# Patient Record
Sex: Female | Born: 1965 | Race: White | Hispanic: No | Marital: Married | Smoking: Former smoker
Health system: Southern US, Community
[De-identification: ages and names within clinical notes are randomized; demographics above are authoritative.]

## PROBLEM LIST (undated history)

## (undated) DIAGNOSIS — K219 Gastro-esophageal reflux disease without esophagitis: Secondary | ICD-10-CM

## (undated) DIAGNOSIS — F329 Major depressive disorder, single episode, unspecified: Secondary | ICD-10-CM

## (undated) DIAGNOSIS — E785 Hyperlipidemia, unspecified: Secondary | ICD-10-CM

## (undated) DIAGNOSIS — F419 Anxiety disorder, unspecified: Secondary | ICD-10-CM

## (undated) DIAGNOSIS — M6740 Ganglion, unspecified site: Secondary | ICD-10-CM

## (undated) DIAGNOSIS — F32A Depression, unspecified: Secondary | ICD-10-CM

## (undated) HISTORY — PX: CYST REMOVAL TRUNK: SHX6283

---

## 2002-08-10 ENCOUNTER — Other Ambulatory Visit: Admission: RE | Admit: 2002-08-10 | Discharge: 2002-08-10 | Payer: Self-pay | Admitting: Obstetrics and Gynecology

## 2003-06-21 ENCOUNTER — Other Ambulatory Visit: Admission: RE | Admit: 2003-06-21 | Discharge: 2003-06-21 | Payer: Self-pay | Admitting: Obstetrics and Gynecology

## 2003-09-11 ENCOUNTER — Other Ambulatory Visit: Admission: RE | Admit: 2003-09-11 | Discharge: 2003-09-11 | Payer: Self-pay | Admitting: Obstetrics and Gynecology

## 2004-02-10 ENCOUNTER — Ambulatory Visit (HOSPITAL_COMMUNITY): Admission: RE | Admit: 2004-02-10 | Discharge: 2004-02-10 | Payer: Self-pay | Admitting: Family Medicine

## 2004-03-31 ENCOUNTER — Other Ambulatory Visit: Admission: RE | Admit: 2004-03-31 | Discharge: 2004-03-31 | Payer: Self-pay | Admitting: Obstetrics and Gynecology

## 2004-04-09 ENCOUNTER — Ambulatory Visit: Payer: Self-pay | Admitting: Internal Medicine

## 2004-04-16 ENCOUNTER — Ambulatory Visit: Payer: Self-pay | Admitting: Internal Medicine

## 2004-04-23 ENCOUNTER — Ambulatory Visit: Payer: Self-pay | Admitting: Internal Medicine

## 2004-04-29 ENCOUNTER — Ambulatory Visit: Payer: Self-pay | Admitting: Internal Medicine

## 2004-05-05 ENCOUNTER — Ambulatory Visit: Payer: Self-pay | Admitting: Internal Medicine

## 2004-05-12 ENCOUNTER — Ambulatory Visit: Payer: Self-pay | Admitting: Internal Medicine

## 2004-05-19 ENCOUNTER — Ambulatory Visit: Payer: Self-pay | Admitting: Internal Medicine

## 2004-06-02 ENCOUNTER — Ambulatory Visit: Payer: Self-pay | Admitting: Internal Medicine

## 2004-06-09 ENCOUNTER — Ambulatory Visit: Payer: Self-pay | Admitting: Internal Medicine

## 2004-06-23 ENCOUNTER — Ambulatory Visit: Payer: Self-pay | Admitting: Internal Medicine

## 2004-06-30 ENCOUNTER — Ambulatory Visit: Payer: Self-pay | Admitting: Internal Medicine

## 2004-07-07 ENCOUNTER — Ambulatory Visit: Payer: Self-pay | Admitting: Internal Medicine

## 2004-07-14 ENCOUNTER — Ambulatory Visit: Payer: Self-pay | Admitting: Internal Medicine

## 2004-07-22 ENCOUNTER — Ambulatory Visit: Payer: Self-pay | Admitting: Internal Medicine

## 2004-07-29 ENCOUNTER — Ambulatory Visit: Payer: Self-pay | Admitting: Internal Medicine

## 2004-08-05 ENCOUNTER — Ambulatory Visit: Payer: Self-pay | Admitting: Internal Medicine

## 2004-08-13 ENCOUNTER — Ambulatory Visit: Payer: Self-pay | Admitting: Internal Medicine

## 2004-08-25 ENCOUNTER — Ambulatory Visit: Payer: Self-pay | Admitting: Internal Medicine

## 2004-09-03 ENCOUNTER — Ambulatory Visit: Payer: Self-pay | Admitting: Internal Medicine

## 2004-09-08 ENCOUNTER — Ambulatory Visit: Payer: Self-pay | Admitting: Internal Medicine

## 2004-09-17 ENCOUNTER — Ambulatory Visit: Payer: Self-pay | Admitting: Internal Medicine

## 2004-09-28 ENCOUNTER — Ambulatory Visit: Payer: Self-pay | Admitting: Internal Medicine

## 2004-10-13 ENCOUNTER — Ambulatory Visit: Payer: Self-pay | Admitting: Internal Medicine

## 2004-10-14 ENCOUNTER — Ambulatory Visit: Payer: Self-pay | Admitting: Internal Medicine

## 2004-10-22 ENCOUNTER — Ambulatory Visit: Payer: Self-pay | Admitting: Internal Medicine

## 2004-10-29 ENCOUNTER — Ambulatory Visit: Payer: Self-pay | Admitting: Internal Medicine

## 2004-11-10 ENCOUNTER — Ambulatory Visit: Payer: Self-pay | Admitting: Internal Medicine

## 2004-11-18 ENCOUNTER — Ambulatory Visit: Payer: Self-pay | Admitting: Internal Medicine

## 2004-11-25 ENCOUNTER — Ambulatory Visit: Payer: Self-pay | Admitting: Internal Medicine

## 2004-12-02 ENCOUNTER — Ambulatory Visit: Payer: Self-pay | Admitting: Internal Medicine

## 2004-12-10 ENCOUNTER — Ambulatory Visit: Payer: Self-pay | Admitting: Internal Medicine

## 2004-12-10 ENCOUNTER — Other Ambulatory Visit: Admission: RE | Admit: 2004-12-10 | Discharge: 2004-12-10 | Payer: Self-pay | Admitting: Obstetrics and Gynecology

## 2004-12-25 ENCOUNTER — Ambulatory Visit: Payer: Self-pay | Admitting: Internal Medicine

## 2004-12-31 ENCOUNTER — Ambulatory Visit: Payer: Self-pay | Admitting: Internal Medicine

## 2005-01-07 ENCOUNTER — Ambulatory Visit: Payer: Self-pay | Admitting: Internal Medicine

## 2005-01-14 ENCOUNTER — Ambulatory Visit: Payer: Self-pay | Admitting: Internal Medicine

## 2005-01-21 ENCOUNTER — Ambulatory Visit: Payer: Self-pay | Admitting: Internal Medicine

## 2005-01-28 ENCOUNTER — Ambulatory Visit: Payer: Self-pay | Admitting: Internal Medicine

## 2005-02-04 ENCOUNTER — Ambulatory Visit: Payer: Self-pay | Admitting: Internal Medicine

## 2005-02-11 ENCOUNTER — Ambulatory Visit: Payer: Self-pay | Admitting: Internal Medicine

## 2005-02-17 ENCOUNTER — Ambulatory Visit: Payer: Self-pay | Admitting: Internal Medicine

## 2005-03-03 ENCOUNTER — Ambulatory Visit: Payer: Self-pay | Admitting: Internal Medicine

## 2005-03-04 ENCOUNTER — Ambulatory Visit: Payer: Self-pay | Admitting: Internal Medicine

## 2005-03-10 ENCOUNTER — Ambulatory Visit: Payer: Self-pay | Admitting: Internal Medicine

## 2005-03-18 ENCOUNTER — Ambulatory Visit: Payer: Self-pay | Admitting: Internal Medicine

## 2005-03-25 ENCOUNTER — Ambulatory Visit: Payer: Self-pay | Admitting: Internal Medicine

## 2005-04-01 ENCOUNTER — Ambulatory Visit: Payer: Self-pay | Admitting: Internal Medicine

## 2005-04-08 ENCOUNTER — Ambulatory Visit: Payer: Self-pay | Admitting: Internal Medicine

## 2005-04-15 ENCOUNTER — Ambulatory Visit: Payer: Self-pay | Admitting: Internal Medicine

## 2005-04-21 ENCOUNTER — Ambulatory Visit: Payer: Self-pay | Admitting: Internal Medicine

## 2005-04-27 ENCOUNTER — Ambulatory Visit: Payer: Self-pay | Admitting: Internal Medicine

## 2005-05-04 ENCOUNTER — Ambulatory Visit: Payer: Self-pay | Admitting: Internal Medicine

## 2005-05-11 ENCOUNTER — Ambulatory Visit: Payer: Self-pay | Admitting: Internal Medicine

## 2005-05-25 ENCOUNTER — Ambulatory Visit: Payer: Self-pay | Admitting: Internal Medicine

## 2005-06-01 ENCOUNTER — Ambulatory Visit: Payer: Self-pay | Admitting: Internal Medicine

## 2005-06-10 ENCOUNTER — Ambulatory Visit: Payer: Self-pay | Admitting: Internal Medicine

## 2005-06-15 ENCOUNTER — Inpatient Hospital Stay (HOSPITAL_COMMUNITY): Admission: AD | Admit: 2005-06-15 | Discharge: 2005-06-18 | Payer: Self-pay | Admitting: Obstetrics and Gynecology

## 2005-06-28 ENCOUNTER — Ambulatory Visit: Payer: Self-pay | Admitting: Internal Medicine

## 2005-07-07 ENCOUNTER — Ambulatory Visit: Payer: Self-pay | Admitting: Internal Medicine

## 2005-07-20 ENCOUNTER — Other Ambulatory Visit: Admission: RE | Admit: 2005-07-20 | Discharge: 2005-07-20 | Payer: Self-pay | Admitting: Obstetrics and Gynecology

## 2005-07-21 ENCOUNTER — Ambulatory Visit: Payer: Self-pay | Admitting: Internal Medicine

## 2005-07-28 ENCOUNTER — Ambulatory Visit: Payer: Self-pay | Admitting: Internal Medicine

## 2005-08-02 ENCOUNTER — Ambulatory Visit: Payer: Self-pay | Admitting: Internal Medicine

## 2005-08-05 ENCOUNTER — Ambulatory Visit: Payer: Self-pay | Admitting: Internal Medicine

## 2005-08-12 ENCOUNTER — Ambulatory Visit: Payer: Self-pay | Admitting: Internal Medicine

## 2005-08-26 ENCOUNTER — Ambulatory Visit: Payer: Self-pay | Admitting: Internal Medicine

## 2005-09-01 ENCOUNTER — Ambulatory Visit: Payer: Self-pay | Admitting: Internal Medicine

## 2005-09-09 ENCOUNTER — Ambulatory Visit: Payer: Self-pay | Admitting: Internal Medicine

## 2005-09-22 ENCOUNTER — Ambulatory Visit: Payer: Self-pay | Admitting: Internal Medicine

## 2005-09-29 ENCOUNTER — Ambulatory Visit: Payer: Self-pay | Admitting: Internal Medicine

## 2005-10-13 ENCOUNTER — Ambulatory Visit: Payer: Self-pay | Admitting: Internal Medicine

## 2005-10-19 ENCOUNTER — Ambulatory Visit: Payer: Self-pay | Admitting: Internal Medicine

## 2005-10-28 ENCOUNTER — Ambulatory Visit: Payer: Self-pay | Admitting: Internal Medicine

## 2005-11-04 ENCOUNTER — Ambulatory Visit: Payer: Self-pay | Admitting: Internal Medicine

## 2005-11-25 ENCOUNTER — Ambulatory Visit: Payer: Self-pay | Admitting: Internal Medicine

## 2005-12-02 ENCOUNTER — Ambulatory Visit: Payer: Self-pay | Admitting: Internal Medicine

## 2005-12-08 ENCOUNTER — Ambulatory Visit: Payer: Self-pay | Admitting: Internal Medicine

## 2005-12-15 ENCOUNTER — Ambulatory Visit: Payer: Self-pay | Admitting: Internal Medicine

## 2005-12-22 ENCOUNTER — Ambulatory Visit: Payer: Self-pay | Admitting: Internal Medicine

## 2005-12-29 ENCOUNTER — Ambulatory Visit: Payer: Self-pay | Admitting: Internal Medicine

## 2006-01-06 ENCOUNTER — Ambulatory Visit: Payer: Self-pay | Admitting: Internal Medicine

## 2006-01-14 ENCOUNTER — Ambulatory Visit: Payer: Self-pay | Admitting: Internal Medicine

## 2006-01-17 ENCOUNTER — Ambulatory Visit: Payer: Self-pay | Admitting: Internal Medicine

## 2006-02-01 ENCOUNTER — Ambulatory Visit: Payer: Self-pay | Admitting: Internal Medicine

## 2006-02-10 ENCOUNTER — Ambulatory Visit: Payer: Self-pay | Admitting: Internal Medicine

## 2006-02-17 ENCOUNTER — Ambulatory Visit: Payer: Self-pay | Admitting: Internal Medicine

## 2006-03-01 ENCOUNTER — Ambulatory Visit: Payer: Self-pay | Admitting: Internal Medicine

## 2006-03-03 ENCOUNTER — Ambulatory Visit: Payer: Self-pay | Admitting: Internal Medicine

## 2006-03-09 ENCOUNTER — Ambulatory Visit: Payer: Self-pay | Admitting: Internal Medicine

## 2006-03-16 ENCOUNTER — Ambulatory Visit: Payer: Self-pay | Admitting: Internal Medicine

## 2006-03-24 ENCOUNTER — Ambulatory Visit: Payer: Self-pay | Admitting: Internal Medicine

## 2006-04-01 ENCOUNTER — Ambulatory Visit: Payer: Self-pay | Admitting: Internal Medicine

## 2006-04-08 ENCOUNTER — Ambulatory Visit: Payer: Self-pay | Admitting: Internal Medicine

## 2006-04-15 ENCOUNTER — Ambulatory Visit: Payer: Self-pay | Admitting: Internal Medicine

## 2006-04-20 ENCOUNTER — Ambulatory Visit: Payer: Self-pay | Admitting: Internal Medicine

## 2006-04-27 ENCOUNTER — Ambulatory Visit: Payer: Self-pay | Admitting: Internal Medicine

## 2006-05-02 ENCOUNTER — Ambulatory Visit: Payer: Self-pay | Admitting: Internal Medicine

## 2006-05-12 ENCOUNTER — Ambulatory Visit: Payer: Self-pay | Admitting: Internal Medicine

## 2006-05-20 ENCOUNTER — Ambulatory Visit: Payer: Self-pay | Admitting: Internal Medicine

## 2006-05-27 ENCOUNTER — Ambulatory Visit: Payer: Self-pay | Admitting: Internal Medicine

## 2006-05-31 ENCOUNTER — Ambulatory Visit: Payer: Self-pay | Admitting: Internal Medicine

## 2006-06-03 ENCOUNTER — Ambulatory Visit: Payer: Self-pay | Admitting: Internal Medicine

## 2006-06-09 ENCOUNTER — Ambulatory Visit: Payer: Self-pay | Admitting: Internal Medicine

## 2006-06-15 ENCOUNTER — Ambulatory Visit: Payer: Self-pay | Admitting: Internal Medicine

## 2006-06-23 ENCOUNTER — Ambulatory Visit: Payer: Self-pay | Admitting: Internal Medicine

## 2006-06-30 ENCOUNTER — Ambulatory Visit: Payer: Self-pay | Admitting: Internal Medicine

## 2006-07-07 ENCOUNTER — Ambulatory Visit: Payer: Self-pay | Admitting: Internal Medicine

## 2006-07-14 ENCOUNTER — Ambulatory Visit: Payer: Self-pay | Admitting: Internal Medicine

## 2006-07-22 ENCOUNTER — Ambulatory Visit: Payer: Self-pay | Admitting: Internal Medicine

## 2006-07-28 ENCOUNTER — Ambulatory Visit: Payer: Self-pay | Admitting: Internal Medicine

## 2006-08-04 ENCOUNTER — Ambulatory Visit: Payer: Self-pay | Admitting: Internal Medicine

## 2006-08-11 ENCOUNTER — Ambulatory Visit: Payer: Self-pay | Admitting: Internal Medicine

## 2006-08-18 ENCOUNTER — Ambulatory Visit: Payer: Self-pay | Admitting: Internal Medicine

## 2006-08-25 ENCOUNTER — Ambulatory Visit: Payer: Self-pay | Admitting: Internal Medicine

## 2006-09-08 ENCOUNTER — Ambulatory Visit: Payer: Self-pay | Admitting: Internal Medicine

## 2006-09-15 ENCOUNTER — Ambulatory Visit: Payer: Self-pay | Admitting: Internal Medicine

## 2006-09-23 ENCOUNTER — Ambulatory Visit: Payer: Self-pay | Admitting: Internal Medicine

## 2006-10-06 ENCOUNTER — Ambulatory Visit: Payer: Self-pay | Admitting: Internal Medicine

## 2006-10-14 ENCOUNTER — Ambulatory Visit: Payer: Self-pay | Admitting: Internal Medicine

## 2006-10-17 ENCOUNTER — Ambulatory Visit: Payer: Self-pay | Admitting: Internal Medicine

## 2006-10-28 ENCOUNTER — Ambulatory Visit: Payer: Self-pay | Admitting: Internal Medicine

## 2006-11-04 ENCOUNTER — Ambulatory Visit: Payer: Self-pay | Admitting: Internal Medicine

## 2006-11-10 ENCOUNTER — Ambulatory Visit: Payer: Self-pay | Admitting: Internal Medicine

## 2006-11-30 ENCOUNTER — Ambulatory Visit: Payer: Self-pay | Admitting: Internal Medicine

## 2006-12-16 ENCOUNTER — Ambulatory Visit: Payer: Self-pay | Admitting: Internal Medicine

## 2006-12-22 ENCOUNTER — Ambulatory Visit: Payer: Self-pay | Admitting: Internal Medicine

## 2007-01-05 ENCOUNTER — Ambulatory Visit: Payer: Self-pay | Admitting: Internal Medicine

## 2007-01-24 ENCOUNTER — Ambulatory Visit: Payer: Self-pay | Admitting: Internal Medicine

## 2007-02-02 ENCOUNTER — Ambulatory Visit: Payer: Self-pay | Admitting: Internal Medicine

## 2007-02-09 ENCOUNTER — Ambulatory Visit: Payer: Self-pay | Admitting: Internal Medicine

## 2007-02-23 ENCOUNTER — Ambulatory Visit: Payer: Self-pay | Admitting: Internal Medicine

## 2007-03-07 ENCOUNTER — Ambulatory Visit: Payer: Self-pay | Admitting: Internal Medicine

## 2007-03-17 ENCOUNTER — Ambulatory Visit: Payer: Self-pay | Admitting: Internal Medicine

## 2007-03-28 ENCOUNTER — Ambulatory Visit: Payer: Self-pay | Admitting: Internal Medicine

## 2007-04-11 ENCOUNTER — Ambulatory Visit: Payer: Self-pay | Admitting: Internal Medicine

## 2007-04-20 ENCOUNTER — Ambulatory Visit: Payer: Self-pay | Admitting: Internal Medicine

## 2007-04-28 ENCOUNTER — Ambulatory Visit: Payer: Self-pay | Admitting: Internal Medicine

## 2007-05-01 ENCOUNTER — Ambulatory Visit: Payer: Self-pay | Admitting: Internal Medicine

## 2007-05-04 ENCOUNTER — Ambulatory Visit: Payer: Self-pay | Admitting: Internal Medicine

## 2007-05-11 ENCOUNTER — Ambulatory Visit: Payer: Self-pay | Admitting: Internal Medicine

## 2007-05-18 ENCOUNTER — Ambulatory Visit: Payer: Self-pay | Admitting: Internal Medicine

## 2007-06-01 ENCOUNTER — Ambulatory Visit: Payer: Self-pay | Admitting: Internal Medicine

## 2007-06-15 ENCOUNTER — Ambulatory Visit: Payer: Self-pay | Admitting: Internal Medicine

## 2007-06-22 ENCOUNTER — Ambulatory Visit: Payer: Self-pay | Admitting: Internal Medicine

## 2007-07-05 ENCOUNTER — Ambulatory Visit: Payer: Self-pay | Admitting: Internal Medicine

## 2009-06-26 ENCOUNTER — Ambulatory Visit (HOSPITAL_BASED_OUTPATIENT_CLINIC_OR_DEPARTMENT_OTHER): Admission: RE | Admit: 2009-06-26 | Discharge: 2009-06-26 | Payer: Self-pay | Admitting: General Surgery

## 2010-05-07 NOTE — Miscellaneous (Signed)
Summary: Injection Record / [facility]    Injection Record / [facility]    Imported By: Lennie Odor 08/26/2009 15:57:29  _____________________________________________________________________  External Attachment:    Type:   Image     Comment:   External Document

## 2010-06-29 LAB — POCT HEMOGLOBIN-HEMACUE: Hemoglobin: 13.6 g/dL (ref 12.0–15.0)

## 2010-08-21 NOTE — Discharge Summary (Signed)
NAMEALEXANDRE, LIGHTSEY                  ACCOUNT NO.:  [id number]   MEDICAL RECORD NO.:  [id number]          PATIENT TYPE:  INP   LOCATION:  9113                          FACILITY:  [facility]   PHYSICIAN:  Duke Salvia. Marcelle Overlie, M.D.DATE OF BIRTH:  February 24, 1966   DATE OF ADMISSION:  06/15/2005  DATE OF DISCHARGE:  06/18/2005                                 DISCHARGE SUMMARY   ADMITTING DIAGNOSES:  1.  Intrauterine pregnancy at term.  2.  Spontaneous onset of labor.   DISCHARGE DIAGNOSIS:  1.  Status post low transverse cesarean section secondary to failure to      descend.  2.  Viable female infant.   PROCEDURE:  Primary low transverse cesarean section.   REASON FOR ADMISSION:  Please see written H&P.   HOSPITAL COURSE:  The patient is 45 year old primigravida married female  that presented to [facility] at term with spontaneous onset  of labor.  The patient did progress to complete dilatation.  After pushing  for approximately 2-1/2 hours, vertex remained arrested at a -2 station.  There was no further descent with pushing.  Decision was made to proceed  with a primary low transverse cesarean section.  The patient was then  transferred to the operating room where epidural was dosed to an adequate  surgical level.  A low transverse incision was made with delivery of a  viable female infant weighing 7 pounds 0 ounces, Apgars of 9 at 1 minute, 9 at  5 minute.  Umbilical artery pH was 7.33.  The patient tolerated the  procedure well and was taken to the recovery room in stable condition.   On postoperative day 1, patient had vomited earlier in the morning; however,  at the time of rounding,  nausea was resolved.  Vital signs were stable.  She was afebrile.  Abdomen soft, slightly distended with positive bowel  sounds.  Fundus was firm and nontender.  Abdominal dressing was noted to  have a scant amount of old drainage noted on bandage.  Foley was connected  to straight drain and  urine appeared to be concentrated.  I&O revealed  intake of 500 and output 350 cc.  Laboratory findings revealed hemoglobin of  9.2, platelet count of 258,000, WBC count of 25.0.   On postoperative day 2, nausea and vomiting had resolved.  The patient was  now tolerating a regular diet with no complaints of any further nausea,  vomiting.  Vital signs remained stable.  Abdomen soft with good return of  bowel function.  Fundus was firm and nontender.  Abdominal dressing was  noted to be clean, dry and intact.   On postoperative day 3, patient was without complaint.  Vital signs remained  stable.  She is afebrile.  Abdomen soft.  Fundus firm and nontender.  Incision was clean, dry and intact.  Staples were removed.  Discharge instructions reviewed and the patient was discharged home.   CONDITION ON DISCHARGE:  Stable.   DIET:  Regular as tolerated.   ACTIVITY:  No heavy lifting.  No driving x2 weeks.  No vaginal  entry.   FOLLOWUP:  The patient is to follow up in the office in 1-2 weeks for an  incision check.  She is to call for a temperature greater than 100 degrees,  persistent of nausea/vomiting, heavy vaginal bleeding and/or redness or  drainage from incisional site.   DISCHARGE MEDICATIONS:  Tylox #30 1 p.o. every 4-6 hours p.r.n., Motrin 600  mg every 6 hours, prenatal vitamins 1 p.o. daily, Colace 1 p.o. daily p.r.n.      Julio Sicks, N.P.      Richard M. Marcelle Overlie, M.D.  Electronically Signed    CC/MEDQ  D:  07/13/2005  T:  07/13/2005  Job:  [id number]

## 2010-08-21 NOTE — Assessment & Plan Note (Signed)
[facility]                             PULMONARY OFFICE NOTE   Christine Giles, Christine Giles                       MRN:          [id number]  DATE:03/03/2006                            DOB:          04/09/1965    PROBLEM LIST:  1. Allergic rhinitis.  2. Mild intermittent asthma.   HISTORY:  This is a former smoker previously followed at [facility] where she was last seen 5 years ago. She has continued on  allergy vaccine getting her injections here and says that it has  changed her life. She had called [place] a year ago while pregnant to  discuss continuing allergy vaccine and had no problems at all, now with  a baby boy 71 months old. Triggers include definite sensitivity to cats  which she avoids. Originally there was a strong seasonal pattern which  has become perennial in recent years. She had smoked some before  quitting 4 years ago and did wheeze a little then but never since and  has had no diagnosis of asthma. No history of triggers including latex  or aspirin. Home environment includes no smokers, there are 2 dogs, no  cats.   FAMILY HISTORY:  Sister with asthma and allergic rhinitis. She works in  an office environment and identifies no particular issues related to  home or work place. We spent time today discussing the risk/benefit if  allergy vaccine, anaphylaxis and epinephrine and typical duration of  therapy. We discussed ways to stop allergy vaccine and she is  considering stopping as discussed, after another year.   MEDICATIONS:  Multivitamin, allergy vaccine, occasional Ambien. No  medication allergy.   OBJECTIVE:  VITAL SIGNS:  Weight 130 pounds, BP 102/58, pulse regular  70. Room air saturation 98%.  GENERAL:  She is a comfortable appearing young woman.  SKIN:  No rash.  ADENOPATHY:  None at the neck or shoulders.  HEENT:  Conjunctivae, nose and pharynx are clear.  CHEST:  Lungs clear to P&A.  HEART:  Sounds regular  without murmur.   IMPRESSION:  Allergic rhinitis doing very well. Minimal intermittent  asthma by history.   PLAN:  She is going to continue allergy vaccine for another year and  schedule return in one year to followup with consideration of whether to  quit at that time.     Clinton D. Maple Hudson, MD, Tonny Bollman, FACP  Electronically Signed    CDY/MedQ  DD: 03/03/2006  DT: 03/04/2006  Job #: [id number]   cc:   Gretta Arab. Valentina Lucks, M.D.

## 2010-08-21 NOTE — Assessment & Plan Note (Signed)
[facility]                             PULMONARY OFFICE NOTE   ARIELA, MOCHIZUKI                         MRN:          [id number]  DATE:05/02/2006                            DOB:          December 18, 1965    HISTORY OF PRESENT ILLNESS:  A 45 year old white female patient of Dr.  Maple Hudson who has a known history of allergic rhinitis and mild intermittent  asthma, presents for an acute office visit.  The patient complains over  the last 4 weeks that she has had increasing progressively worsen nasal  congestion and stuffiness and she has been using Afrin on a daily basis.  She denies any purulent sputum, fevers, chest pain, orthopnea, PND or  wheezing.  The patient is on allergy vaccines 1:10 weekly at our allergy  clinic.   Past medical history is reviewed, current medications reviewed.   PHYSICAL EXAMINATION:  The patient is a pleasant female in no acute  distress.  She is afebrile, with stable vital signs.  Oxygen saturation  is 98% on room air.  HEENT:  Nasal mucosa slightly pale.  Conjuctiva  non injected.  Nontender sinuses.  Posterior pharynx clear.  NECK:  Supple, without adenopathy.  LUNG SOUNDS:  Clear, without any wheezing or crackles.  CARDIAC:  Regular rate and rhythm.  ABDOMEN:  Soft, nontender.  EXTREMITIES:  Without any edema.   IMPRESSION AND PLAN:  Rhinitis.  Plan:  Is encouraged to wean off of  Afrin.  Continue on saline nasal spray p.r.n.  Add in Evaro D p.r.n.  Begin Veramyst Nasal 2 puffs twice daily.  Samples were given.  The is  to recheck with Dr. Maple Hudson in 2 weeks or sooner if needed.      Rubye Oaks, NP  Electronically Signed      Clinton D. Maple Hudson, MD, Tonny Bollman, FACP  Electronically Signed   TP/MedQ  DD: 05/03/2006  DT: 05/03/2006  Job #: [id number]

## 2010-08-21 NOTE — Op Note (Signed)
NAMEMARIONA, Christine Giles NO.:  [id number]   MEDICAL RECORD NO.:  [id number]          PATIENT TYPE:  INP   LOCATION:  9113                          FACILITY:  [facility]   PHYSICIAN:  Juluis Mire, M.D.   DATE OF BIRTH:  [birth year]   DATE OF PROCEDURE:  06/15/2005  DATE OF DISCHARGE:                                 OPERATIVE REPORT   PREOPERATIVE DIAGNOSIS:  Intrauterine pregnancy at term with failure to  descend.   POSTOPERATIVE DIAGNOSIS:  Intrauterine pregnancy at term with failure to  descend.   OPERATION/PROCEDURE:  Low transverse cesarean section.   SURGEON:  Juluis Mire, M.D.   ANESTHESIA:  Epidural.   ESTIMATED BLOOD LOSS:  500 mL.   PACKS AND DRAINS:  None.   INTRAOPERATIVE BLOOD REPLACED:  None.   COMPLICATIONS:  None.   INDICATIONS:  The patient is a 45 year old, primigravida, married female who  presents at term with spontaneous onset of labor.  Progressed to complete  dilatation.  After 2-1/2 hours of pushing, vertex remained arrested at -2  station.  There was no descent with pushing.  The diagnosis of failure to  descend was made.  The patient was made ready for primary cesarean section.  The risks were explained including risk of infection, risk of hemorrhage  that could require transfusion with the risk of AIDS or hepatitis, risk of  injury to adjacent organs requiring further exploratory surgery, risk of  deep venous thrombosis and pulmonary embolus.   DESCRIPTION OF PROCEDURE:  The patient was taken to the OR,  placed in the  supine position with left lateral tilt.  After satisfactory level of  epidural anesthesia was obtained, the abdomen was prepped out with Betadine  and draped with sterile field.  A low transverse skin incision was made with  the knife and carried through the subcutaneous tissue.  Fascia entered  sharply.  Incision in the fascia was extended laterally.  Fascia taken off  the muscles superiorly and inferiorly.   Rectus muscles were separated in the  midline.  Peritoneum was entered sharply and incision in the peritoneum  extended both superiorly and inferiorly.  A low transverse bladder flap was  developed.  A low transverse uterine incision begun with the knife and  extended laterally using manual traction.  Amniotic fluid was clear.  Infant  presented in the vertex presentation with occiput posterior.  The infant was  delivered with elevation of the head and fundal pressure.  The infant was a  live female who weighed 7 pounds.  Apgars were 9/9.  Umbilical artery PH was  7.33.  Placenta was delivered manually.  Uterus was wiped free of the  remaining membranes and placenta.  Uterus then exteriorized for closure.  Uterus was closed with interlocking suture of 0 chromic using a two-layer  closure technique.  Good hemostasis was noted.  Urine output was clear and  adequate.  Tubes and ovaries were unremarkable.  Uterus was returned to the  abdominal cavity.  We irrigated the pelvis.  Muscles were reapproximated  with running suture of  3-0 Vicryl.  Fascia was closed with running suture of  0  PDS.  Skin was closed with staples and Steri-Strips.  Sponge, instrument and  needle counts were reported as correct by the circulating nurse x2.  Foley  catheter remained clear at time of closure.  The patient tolerated the  procedure well and was returned to the recovery room in good condition.      Juluis Mire, M.D.  Electronically Signed     JSM/MEDQ  D:  06/15/2005  T:  06/16/2005  Job:  [id number]

## 2010-12-28 ENCOUNTER — Other Ambulatory Visit: Payer: Self-pay | Admitting: Obstetrics and Gynecology

## 2010-12-28 DIAGNOSIS — R928 Other abnormal and inconclusive findings on diagnostic imaging of breast: Secondary | ICD-10-CM

## 2011-01-06 ENCOUNTER — Ambulatory Visit
Admission: RE | Admit: 2011-01-06 | Discharge: 2011-01-06 | Disposition: A | Discharge status: Home / Self Care | Payer: BC Managed Care – PPO | Source: Ambulatory Visit | Attending: Obstetrics and Gynecology | Admitting: Obstetrics and Gynecology

## 2011-01-06 DIAGNOSIS — R928 Other abnormal and inconclusive findings on diagnostic imaging of breast: Secondary | ICD-10-CM

## 2014-10-10 ENCOUNTER — Other Ambulatory Visit: Payer: Self-pay | Admitting: Orthopedic Surgery

## 2014-10-16 ENCOUNTER — Encounter (HOSPITAL_BASED_OUTPATIENT_CLINIC_OR_DEPARTMENT_OTHER): Payer: Self-pay | Admitting: *Deleted

## 2014-10-22 ENCOUNTER — Ambulatory Visit (HOSPITAL_BASED_OUTPATIENT_CLINIC_OR_DEPARTMENT_OTHER)
Admission: RE | Admit: 2014-10-22 | Discharge: 2014-10-22 | Disposition: A | Discharge status: Home / Self Care | Payer: BLUE CROSS/BLUE SHIELD | Source: Ambulatory Visit | Attending: Orthopedic Surgery | Admitting: Orthopedic Surgery

## 2014-10-22 ENCOUNTER — Encounter (HOSPITAL_BASED_OUTPATIENT_CLINIC_OR_DEPARTMENT_OTHER): Payer: Self-pay | Admitting: Certified Registered"

## 2014-10-22 ENCOUNTER — Encounter (HOSPITAL_BASED_OUTPATIENT_CLINIC_OR_DEPARTMENT_OTHER)
Admission: RE | Disposition: A | Discharge status: Home / Self Care | Payer: Self-pay | Source: Ambulatory Visit | Attending: Orthopedic Surgery

## 2014-10-22 ENCOUNTER — Ambulatory Visit (HOSPITAL_BASED_OUTPATIENT_CLINIC_OR_DEPARTMENT_OTHER): Payer: BLUE CROSS/BLUE SHIELD | Admitting: Certified Registered"

## 2014-10-22 DIAGNOSIS — Z87891 Personal history of nicotine dependence: Secondary | ICD-10-CM | POA: Diagnosis not present

## 2014-10-22 DIAGNOSIS — Z793 Long term (current) use of hormonal contraceptives: Secondary | ICD-10-CM | POA: Diagnosis not present

## 2014-10-22 DIAGNOSIS — F329 Major depressive disorder, single episode, unspecified: Secondary | ICD-10-CM | POA: Insufficient documentation

## 2014-10-22 DIAGNOSIS — M6748 Ganglion, other site: Secondary | ICD-10-CM | POA: Insufficient documentation

## 2014-10-22 DIAGNOSIS — E785 Hyperlipidemia, unspecified: Secondary | ICD-10-CM | POA: Insufficient documentation

## 2014-10-22 DIAGNOSIS — K219 Gastro-esophageal reflux disease without esophagitis: Secondary | ICD-10-CM | POA: Insufficient documentation

## 2014-10-22 DIAGNOSIS — R229 Localized swelling, mass and lump, unspecified: Secondary | ICD-10-CM | POA: Diagnosis present

## 2014-10-22 DIAGNOSIS — F419 Anxiety disorder, unspecified: Secondary | ICD-10-CM | POA: Diagnosis not present

## 2014-10-22 DIAGNOSIS — M1990 Unspecified osteoarthritis, unspecified site: Secondary | ICD-10-CM | POA: Diagnosis not present

## 2014-10-22 HISTORY — PX: ROTATION FLAP: SHX6211

## 2014-10-22 HISTORY — DX: Ganglion, unspecified site: M67.40

## 2014-10-22 HISTORY — PX: EAR CYST EXCISION: SHX22

## 2014-10-22 HISTORY — DX: Major depressive disorder, single episode, unspecified: F32.9

## 2014-10-22 HISTORY — DX: Anxiety disorder, unspecified: F41.9

## 2014-10-22 HISTORY — DX: Depression, unspecified: F32.A

## 2014-10-22 HISTORY — DX: Hyperlipidemia, unspecified: E78.5

## 2014-10-22 HISTORY — DX: Gastro-esophageal reflux disease without esophagitis: K21.9

## 2014-10-22 LAB — POCT HEMOGLOBIN-HEMACUE: Hemoglobin: 13.9 g/dL (ref 12.0–15.0)

## 2014-10-22 SURGERY — CYST REMOVAL
Anesthesia: Regional | Site: Finger | Laterality: Right

## 2014-10-22 MED ORDER — MIDAZOLAM HCL 2 MG/2ML IJ SOLN
1.0000 mg | INTRAMUSCULAR | Status: DC | PRN
  Administered 2014-10-22: 2 mg via INTRAVENOUS

## 2014-10-22 MED ORDER — LIDOCAINE HCL (PF) 0.5 % IJ SOLN
INTRAMUSCULAR | Status: DC | PRN
  Administered 2014-10-22: 50 mL via INTRAVENOUS

## 2014-10-22 MED ORDER — BUPIVACAINE HCL (PF) 0.25 % IJ SOLN
INTRAMUSCULAR | Status: DC | PRN
  Administered 2014-10-22: 7 mL

## 2014-10-22 MED ORDER — CHLORHEXIDINE GLUCONATE 4 % EX LIQD: 60.0000 mL | Freq: Once | CUTANEOUS | Status: DC

## 2014-10-22 MED ORDER — SCOPOLAMINE 1 MG/3DAYS TD PT72: 1.0000 | MEDICATED_PATCH | Freq: Once | TRANSDERMAL | Status: DC | PRN

## 2014-10-22 MED ORDER — PROPOFOL INFUSION 10 MG/ML OPTIME
INTRAVENOUS | Status: DC | PRN
  Administered 2014-10-22: 100 ug/kg/min via INTRAVENOUS

## 2014-10-22 MED ORDER — CEFAZOLIN SODIUM-DEXTROSE 2-3 GM-% IV SOLR: 2.0000 g | INTRAVENOUS | Status: DC

## 2014-10-22 MED ORDER — GLYCOPYRROLATE 0.2 MG/ML IJ SOLN: 0.2000 mg | Freq: Once | INTRAMUSCULAR | Status: DC | PRN

## 2014-10-22 MED ORDER — FENTANYL CITRATE (PF) 100 MCG/2ML IJ SOLN
50.0000 ug | INTRAMUSCULAR | Status: AC | PRN
  Administered 2014-10-22: 50 ug via INTRAVENOUS
  Administered 2014-10-22: 25 ug via INTRAVENOUS
  Administered 2014-10-22: 50 ug via INTRAVENOUS

## 2014-10-22 MED ORDER — MIDAZOLAM HCL 2 MG/2ML IJ SOLN
INTRAMUSCULAR | Status: AC
Start: 2014-10-22 — End: 2014-10-22
  Filled 2014-10-22: qty 2

## 2014-10-22 MED ORDER — CEFAZOLIN SODIUM-DEXTROSE 2-3 GM-% IV SOLR
2.0000 g | INTRAVENOUS | Status: AC
  Administered 2014-10-22: 2 g via INTRAVENOUS

## 2014-10-22 MED ORDER — HYDROCODONE-ACETAMINOPHEN 5-325 MG PO TABS: 1.0000 | ORAL_TABLET | Freq: Four times a day (QID) | ORAL | Status: DC | PRN

## 2014-10-22 MED ORDER — BUPIVACAINE HCL (PF) 0.25 % IJ SOLN
INTRAMUSCULAR | Status: AC
  Filled 2014-10-22: qty 30

## 2014-10-22 MED ORDER — CEFAZOLIN SODIUM-DEXTROSE 2-3 GM-% IV SOLR
INTRAVENOUS | Status: AC
  Filled 2014-10-22: qty 50

## 2014-10-22 MED ORDER — FENTANYL CITRATE (PF) 100 MCG/2ML IJ SOLN
INTRAMUSCULAR | Status: AC
  Filled 2014-10-22: qty 6

## 2014-10-22 MED ORDER — LACTATED RINGERS IV SOLN
INTRAVENOUS | Status: DC
  Administered 2014-10-22 (×2): via INTRAVENOUS

## 2014-10-22 MED ORDER — PROPOFOL 500 MG/50ML IV EMUL
INTRAVENOUS | Status: AC
  Filled 2014-10-22: qty 50

## 2014-10-22 SURGICAL SUPPLY — 47 items
BANDAGE COBAN STERILE 2 (GAUZE/BANDAGES/DRESSINGS) IMPLANT
BLADE MINI RND TIP GREEN BEAV (BLADE) IMPLANT
BLADE SURG 15 STRL LF DISP TIS (BLADE) ×1 IMPLANT
BLADE SURG 15 STRL SS (BLADE) ×1
BNDG COHESIVE 1X5 TAN STRL LF (GAUZE/BANDAGES/DRESSINGS) IMPLANT
BNDG COHESIVE 3X5 TAN STRL LF (GAUZE/BANDAGES/DRESSINGS) IMPLANT
BNDG ESMARK 4X9 LF (GAUZE/BANDAGES/DRESSINGS) IMPLANT
BNDG GAUZE ELAST 4 BULKY (GAUZE/BANDAGES/DRESSINGS) IMPLANT
CHLORAPREP W/TINT 26ML (MISCELLANEOUS) ×2 IMPLANT
CORDS BIPOLAR (ELECTRODE) ×2 IMPLANT
COVER BACK TABLE 60X90IN (DRAPES) ×2 IMPLANT
COVER MAYO STAND STRL (DRAPES) ×2 IMPLANT
CUFF TOURNIQUET SINGLE 18IN (TOURNIQUET CUFF) ×2 IMPLANT
DECANTER SPIKE VIAL GLASS SM (MISCELLANEOUS) IMPLANT
DRAIN PENROSE 1/2X12 LTX STRL (WOUND CARE) IMPLANT
DRAPE EXTREMITY T 121X128X90 (DRAPE) ×2 IMPLANT
DRAPE SURG 17X23 STRL (DRAPES) ×2 IMPLANT
GAUZE SPONGE 4X4 12PLY STRL (GAUZE/BANDAGES/DRESSINGS) ×2 IMPLANT
GAUZE XEROFORM 1X8 LF (GAUZE/BANDAGES/DRESSINGS) ×2 IMPLANT
GLOVE BIOGEL PI IND STRL 7.0 (GLOVE) ×2 IMPLANT
GLOVE BIOGEL PI IND STRL 8.5 (GLOVE) ×1 IMPLANT
GLOVE BIOGEL PI INDICATOR 7.0 (GLOVE) ×2
GLOVE BIOGEL PI INDICATOR 8.5 (GLOVE) ×1
GLOVE ECLIPSE 6.5 STRL STRAW (GLOVE) ×2 IMPLANT
GLOVE SURG ORTHO 8.0 STRL STRW (GLOVE) ×2 IMPLANT
GOWN STRL REUS W/ TWL LRG LVL3 (GOWN DISPOSABLE) ×1 IMPLANT
GOWN STRL REUS W/TWL LRG LVL3 (GOWN DISPOSABLE) ×1
GOWN STRL REUS W/TWL XL LVL3 (GOWN DISPOSABLE) ×2 IMPLANT
NEEDLE PRECISIONGLIDE 27X1.5 (NEEDLE) ×2 IMPLANT
NS IRRIG 1000ML POUR BTL (IV SOLUTION) ×2 IMPLANT
PACK BASIN DAY SURGERY FS (CUSTOM PROCEDURE TRAY) ×2 IMPLANT
PAD CAST 3X4 CTTN HI CHSV (CAST SUPPLIES) IMPLANT
PADDING CAST ABS 3INX4YD NS (CAST SUPPLIES)
PADDING CAST ABS 4INX4YD NS (CAST SUPPLIES) ×1
PADDING CAST ABS COTTON 3X4 (CAST SUPPLIES) IMPLANT
PADDING CAST ABS COTTON 4X4 ST (CAST SUPPLIES) ×1 IMPLANT
PADDING CAST COTTON 3X4 STRL (CAST SUPPLIES)
SPLINT FINGER 4.25 BULB 911906 (SOFTGOODS) ×2 IMPLANT
SPLINT PLASTER CAST XFAST 3X15 (CAST SUPPLIES) IMPLANT
SPLINT PLASTER XTRA FASTSET 3X (CAST SUPPLIES)
STOCKINETTE 4X48 STRL (DRAPES) ×2 IMPLANT
SUT ETHILON 4 0 PS 2 18 (SUTURE) ×2 IMPLANT
SUT VIC AB 4-0 P2 18 (SUTURE) IMPLANT
SYR BULB 3OZ (MISCELLANEOUS) ×2 IMPLANT
SYR CONTROL 10ML LL (SYRINGE) ×2 IMPLANT
TOWEL OR 17X24 6PK STRL BLUE (TOWEL DISPOSABLE) ×4 IMPLANT
UNDERPAD 30X30 (UNDERPADS AND DIAPERS) ×2 IMPLANT

## 2014-10-22 NOTE — Anesthesia Preprocedure Evaluation (Addendum)
Anesthesia Evaluation  Patient identified by MRN, date of birth, ID band Patient awake    Reviewed: Allergy & Precautions, NPO status , Patient's Chart, lab work & pertinent test results  History of Anesthesia Complications Negative for: history of anesthetic complications  Airway Mallampati: I  TM Distance: >3 FB Neck ROM: Full    Dental  (+) Teeth Intact, Dental Advisory Given   Pulmonary former smoker,    Pulmonary exam normal       Cardiovascular negative cardio ROS Normal cardiovascular exam    Neuro/Psych PSYCHIATRIC DISORDERS Anxiety Depression negative neurological ROS     GI/Hepatic Neg liver ROS, GERD-  Medicated,  Endo/Other  negative endocrine ROS  Renal/GU negative Renal ROS     Musculoskeletal   Abdominal   Peds  Hematology   Anesthesia Other Findings   Reproductive/Obstetrics                            Anesthesia Physical Anesthesia Plan  ASA: I  Anesthesia Plan: MAC and Bier Block   Post-op Pain Management:    Induction: Intravenous  Airway Management Planned: Simple Face Mask  Additional Equipment:   Intra-op Plan:   Post-operative Plan:   Informed Consent: I have reviewed the patients History and Physical, chart, labs and discussed the procedure including the risks, benefits and alternatives for the proposed anesthesia with the patient or authorized representative who has indicated his/her understanding and acceptance.   Dental advisory given  Plan Discussed with: CRNA, Anesthesiologist and Surgeon  Anesthesia Plan Comments:         Anesthesia Quick Evaluation

## 2014-10-22 NOTE — Op Note (Signed)
NAMLacey Giles:  Mabie, Christine Giles                  ACCOUNT NO.:  [id number]  MEDICAL RECORD NO.:  [id number]  LOCATION:                               FACILITY:  [facility]  PHYSICIAN:  Cindee SaltGary Alorah Mcree, M.D.       DATE OF BIRTH:  [birth year]  DATE OF PROCEDURE:  10/22/2014 DATE OF DISCHARGE:  10/22/2014                              OPERATIVE REPORT   PREOPERATIVE DIAGNOSES:  Large mucoid tumor, distal interphalangeal joint arthritis, right index finger.  POSTOPERATIVE DIAGNOSES:  Large mucoid tumor, distal interphalangeal joint arthritis, right index finger.  OPERATION:  Excision of mucoid cyst, debridement of the distal interphalangeal joint with rotation of skin flap for coverage.  SURGEON:  Cindee SaltGary Joua Bake, M.D.  ANESTHESIA:  Upper arm IV regional.  ANESTHESIOLOGIST:  Quita SkyeJames D. Krista BlueSinger, M.D.  HISTORY:  The patient is a 49 year old female with a large mucoid cyst on the dorsal aspect of her right index finger distal interphalangeal joint with arthritis in the distal interphalangeal joint.  The cyst is approximately a centimeter in diameter with translucent skin.  She is admitted for excision of the cyst, debridement of the distal interphalangeal joint, and rotation of the dorsal skin flap for coverage.  Pre, peri, and postoperative course have been discussed along with risks and complications.  She is aware there is no guarantee with the surgery, possibility of infection, recurrence of injury to arteries, nerves, tendons, incomplete relief of symptoms and dystrophy, possibility of loss of a portion of her elbow flap.  Preoperative area, the patient is seen, the extremity marked by both patient and surgeon. Antibiotic given.  PROCEDURE IN DETAIL:  The patient was brought to the operating room, where an upper arm IV regional anesthetic was carried out without difficulty.  She was prepped using ChloraPrep, supine position with the right arm free.  A 3-minute dry time was allowed.  Time-out taken confirming  the patient and procedure.  A curvilinear incision was made over the dorsal aspect of the finger down to the level of the proximal interphalangeal joint.  The cyst incising the skin was immediately encountered, and that the skin was entirely translucent.  With blunt and sharp dissection, the cyst was finally excised.  The area of extremely thin skin was debrided with sharp dissection.  The joint was then opened on its radial aspect.  This allowed visualization of the joint and debridement performed.  The osteophyte causing the problem was excised with a small rongeur.  Specimen was sent to Pathology.  The wound was copiously irrigated with saline.  The flap was then developed taking care to protect the veins proximally.  This was then rotated distally. The very thin skin was excised in toto.  The flap was then sutured into position with interrupted 4-0 nylon sutures.  A metacarpal block was given with 0.25% bupivacaine without epinephrine, approximately 8 mL was used.  Sterile compressive dressing and splint to the finger applied.  On deflation of the tourniquet, remaining fingers pinked.  She was taken to the recovery room for observation in satisfactory condition.  She will be discharged home [facility] on [place].  ______________________________ Cindee Salt, M.D.   ______________________________ Cindee Salt, M.D.    GK/MEDQ  D:  10/22/2014  T:  10/22/2014  Job:  [id number]

## 2014-10-22 NOTE — Anesthesia Postprocedure Evaluation (Signed)
Anesthesia Post Note  Patient: Christine Giles  Procedure(s) Performed: Procedure(s) (LRB): EXCISION MUCOID TUMOR  (Right) ROTATION FLAP RIGHT INDEX FINGER  (Right)  Anesthesia type: MAC  Patient location: PACU  Post pain: Pain level controlled  Post assessment: Patient's Cardiovascular Status Stable  Last Vitals:  Filed Vitals:   10/22/14 1200  BP: 154/72  Pulse: 81  Temp:   Resp: 11    Post vital signs: Reviewed and stable  Level of consciousness: sedated  Complications: No apparent anesthesia complications

## 2014-10-22 NOTE — Anesthesia Procedure Notes (Signed)
Anesthesia Regional Block:  Bier block (IV Regional)  Pre-Anesthetic Checklist: ,, timeout performed, Correct Patient, Correct Site, Correct Laterality, Correct Procedure,, site marked, surgical consent,, at surgeon's request Needles:  Injection technique: Single-shot  Needle Type: Other      Needle Gauge: 20 and 20 G    Additional Needles: Bier block (IV Regional) Narrative:  Start time: 10/22/2014 10:54 AM End time: 10/22/2014 10:54 AM Injection made incrementally with aspirations every 50 mL.  Performed by: Personally   Additional Notes: Esmark wrap, double torq inflated 290, neg pulse, injected slowly 50 ml Pres free 0.5% Lidocaine, pt tolerated well, no neg side effects. VSS

## 2014-10-22 NOTE — Op Note (Signed)
Dictation Number [id number]

## 2014-10-22 NOTE — H&P (Signed)
Christine Giles is a 49 year-old right-hand dominant former patient, not seen in three years. She is referred by Dr. Valentina LucksGriffin with mass on her right index finger distal interphalangeal joint.  She states this has been present for two months and enlarging.  She has not had any drainage, complains of an aching pain. She has history of arthritis, no history of gout.  There is family history of thyroid problems. She has been tested.  She complains of mild to moderate dull aching type pain.  States that this is getting worse.  Hitting it causes extreme pain.   ALLERGIES:   None.  MEDICATIONS:     She is on birth control pills, gemfibrozil, sertraline and atorvastatin.    SURGICAL HISTORY:     None reported.   FAMILY MEDICAL HISTORY:   Positive for heart disease, arthritis, thyroid problems.    SOCIAL HISTORY:     She does not smoke, drinks socially, she is married and works as a Furniture conservator/restorerproduction manager for [employer].    REVIEW OF SYSTEMS:   Positive for lumps, otherwise negative. Christine Giles is an 49 y.o. female.   Chief Complaint: large mucoid tumor right index finger HPI: see above  Past Medical History  Diagnosis Date  . Depression   . Anxiety   . GERD (gastroesophageal reflux disease)   . Mucoid cyst of joint     right index finger  . Hyperlipemia     Past Surgical History  Procedure Laterality Date  . Cesarean section    . Cyst removal trunk      back    History reviewed. No pertinent family history. Social History:  reports that she has quit smoking. She does not have any smokeless tobacco history on file. She reports that she drinks alcohol. Her drug history is not on file.  Allergies: No Known Allergies  No prescriptions prior to admission    No results found for this or any previous visit (from the past 48 hour(s)).  No results found.   Pertinent items are noted in HPI.  Height 5\' 5"  (1.651 m), weight 69.854 kg (154 lb).  General appearance: alert, cooperative and  appears stated age Head: Normocephalic, without obvious abnormality Neck: no JVD Resp: clear to auscultation bilaterally Cardio: regular rate and rhythm, S1, S2 normal, no murmur, click, rub or gallop GI: soft, non-tender; bowel sounds normal; no masses,  no organomegaly Extremities: degenerative arthritis right index finger with largwe mucoid tumor Pulses: 2+ and symmetric Skin: Skin color, texture, turgor normal. No rashes or lesions Neurologic: Grossly normal Incision/Wound: na  Assessment/Plan RADIOGRAPHS:      X-rays reveal degenerative changes distal interphalangeal joint.    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    RECOMMENDATIONS/PLAN:    We have discussed this with her. She is advised that this is likely going to require rotation flap for closure due to its size in that it is almost 1.5 cm. in diameter.  The pre, peri and postoperative course were discussed along with the risks and complications.  The patient is aware there is no guarantee with the surgery, possibility of infection, recurrence, injury to arteries, nerves, tendons, incomplete relief of symptoms and dystrophy, loss of the rotation flap.  She is advised the importance of not having this open.  If it should, she needs to be placed on an antibiotic to try to prevent infection to the joint.  This is  scheduled as an outpatient under regional anesthesia for excision mucoid cyst, debridement distal interphalangeal joint right index finger rotation flap.    Shayan Bramhall R 10/22/2014, 7:53 AM

## 2014-10-22 NOTE — Discharge Instructions (Addendum)

## 2014-10-22 NOTE — Brief Op Note (Signed)
10/22/2014  11:32 AM  PATIENT:  Christine Giles  49 y.o. female  PRE-OPERATIVE DIAGNOSIS:  MUCOID TUMOR RIGHT INDEX FINGER DEGENERATIVE ARTHRITIS DISTAL JOINT   POST-OPERATIVE DIAGNOSIS:  MUCOID TUMOR RIGHT INDEX FINGER DEGENERATIVE ARTHRITIS DISTAL JOINT   PROCEDURE:  Procedure(s): EXCISION MUCOID TUMOR  (Right) ROTATION FLAP RIGHT INDEX FINGER  (Right)  SURGEON:  Surgeon(s) and Role:    * Cindee SaltGary Kloee Ballew, MD - Primary  PHYSICIAN ASSISTANT:   ASSISTANTS: none   ANESTHESIA:   local and regional  EBL:  Total I/O In: 1000 [I.V.:1000] Out: -   BLOOD ADMINISTERED:none  DRAINS: none   LOCAL MEDICATIONS USED:  BUPIVICAINE   SPECIMEN:  Excision  DISPOSITION OF SPECIMEN:  PATHOLOGY  COUNTS:  YES  TOURNIQUET:   Total Tourniquet Time Documented: Upper Arm (Right) - 38 minutes Total: Upper Arm (Right) - 38 minutes   DICTATION: .Other Dictation: Dictation Number [id number]  PLAN OF CARE: Discharge to home after PACU  PATIENT DISPOSITION:  PACU - hemodynamically stable.

## 2014-10-22 NOTE — Transfer of Care (Signed)
Immediate Anesthesia Transfer of Care Note  Patient: Christine Giles  Procedure(s) Performed: Procedure(s): EXCISION MUCOID TUMOR  (Right) ROTATION FLAP RIGHT INDEX FINGER  (Right)  Patient Location: PACU  Anesthesia Type:Bier block  Level of Consciousness: awake, alert , oriented and patient cooperative  Airway & Oxygen Therapy: Patient Spontanous Breathing and Patient connected to face mask oxygen  Post-op Assessment: Report given to RN and Post -op Vital signs reviewed and stable  Post vital signs: Reviewed and stable  Last Vitals:  Filed Vitals:   10/22/14 0910  BP: 141/60  Pulse: 86  Temp: 36.7 C  Resp: 20    Complications: No apparent anesthesia complications

## 2014-10-23 ENCOUNTER — Encounter (HOSPITAL_BASED_OUTPATIENT_CLINIC_OR_DEPARTMENT_OTHER): Payer: Self-pay | Admitting: Orthopedic Surgery

## 2015-02-10 ENCOUNTER — Encounter: Payer: Self-pay | Admitting: Internal Medicine

## 2015-05-01 ENCOUNTER — Other Ambulatory Visit: Payer: Self-pay | Admitting: Obstetrics and Gynecology

## 2015-05-01 DIAGNOSIS — R928 Other abnormal and inconclusive findings on diagnostic imaging of breast: Secondary | ICD-10-CM

## 2015-05-06 ENCOUNTER — Ambulatory Visit
Admission: RE | Admit: 2015-05-06 | Discharge: 2015-05-06 | Disposition: A | Discharge status: Home / Self Care | Payer: BLUE CROSS/BLUE SHIELD | Source: Ambulatory Visit | Attending: Obstetrics and Gynecology | Admitting: Obstetrics and Gynecology

## 2015-05-06 DIAGNOSIS — R928 Other abnormal and inconclusive findings on diagnostic imaging of breast: Secondary | ICD-10-CM

## 2015-05-07 ENCOUNTER — Other Ambulatory Visit: Payer: BLUE CROSS/BLUE SHIELD

## 2015-05-12 ENCOUNTER — Ambulatory Visit
Admission: RE | Admit: 2015-05-12 | Discharge: 2015-05-12 | Disposition: A | Discharge status: Home / Self Care | Payer: 59 | Source: Ambulatory Visit | Attending: Family Medicine | Admitting: Family Medicine

## 2015-05-12 ENCOUNTER — Other Ambulatory Visit: Payer: Self-pay | Admitting: Family Medicine

## 2015-05-12 ENCOUNTER — Other Ambulatory Visit (HOSPITAL_COMMUNITY): Payer: Self-pay | Admitting: Respiratory Therapy

## 2015-05-12 DIAGNOSIS — R0602 Shortness of breath: Secondary | ICD-10-CM

## 2015-05-12 DIAGNOSIS — R0600 Dyspnea, unspecified: Secondary | ICD-10-CM

## 2015-05-15 ENCOUNTER — Ambulatory Visit (HOSPITAL_COMMUNITY)
Admission: RE | Admit: 2015-05-15 | Discharge: 2015-05-15 | Disposition: A | Discharge status: Home / Self Care | Payer: 59 | Source: Ambulatory Visit | Attending: Family Medicine | Admitting: Family Medicine

## 2015-05-15 DIAGNOSIS — R0600 Dyspnea, unspecified: Secondary | ICD-10-CM | POA: Diagnosis present

## 2015-05-15 LAB — PULMONARY FUNCTION TEST
DL/VA % PRED: 90 %
DL/VA: 4.24 ml/min/mmHg/L
DLCO UNC % PRED: 76 %
DLCO unc: 17.57 ml/min/mmHg
FEF 25-75 PRE: 0.82 L/s
FEF 25-75 Post: 1.3 L/sec
FEF2575-%CHANGE-POST: 59 %
FEF2575-%PRED-POST: 47 %
FEF2575-%PRED-PRE: 29 %
FEV1-%CHANGE-POST: 15 %
FEV1-%PRED-POST: 71 %
FEV1-%Pred-Pre: 61 %
FEV1-PRE: 1.69 L
FEV1-Post: 1.96 L
FEV1FVC-%CHANGE-POST: 14 %
FEV1FVC-%Pred-Pre: 76 %
FEV6-%Change-Post: 4 %
FEV6-%PRED-PRE: 77 %
FEV6-%Pred-Post: 81 %
FEV6-PRE: 2.61 L
FEV6-Post: 2.74 L
FEV6FVC-%Change-Post: 4 %
FEV6FVC-%PRED-PRE: 96 %
FEV6FVC-%Pred-Post: 100 %
FVC-%CHANGE-POST: 0 %
FVC-%PRED-POST: 81 %
FVC-%PRED-PRE: 80 %
FVC-POST: 2.8 L
FVC-PRE: 2.78 L
POST FEV1/FVC RATIO: 70 %
POST FEV6/FVC RATIO: 98 %
PRE FEV1/FVC RATIO: 61 %
Pre FEV6/FVC Ratio: 94 %
RV % pred: 110 %
RV: 1.89 L
TLC % pred: 93 %
TLC: 4.6 L

## 2015-05-15 MED ORDER — ALBUTEROL SULFATE (2.5 MG/3ML) 0.083% IN NEBU
2.5000 mg | INHALATION_SOLUTION | Freq: Once | RESPIRATORY_TRACT | Status: AC
  Administered 2015-05-15: 2.5 mg via RESPIRATORY_TRACT

## 2016-05-04 DIAGNOSIS — Z23 Encounter for immunization: Secondary | ICD-10-CM | POA: Diagnosis not present

## 2016-06-01 DIAGNOSIS — Z1231 Encounter for screening mammogram for malignant neoplasm of breast: Secondary | ICD-10-CM | POA: Diagnosis not present

## 2016-06-01 DIAGNOSIS — Z6823 Body mass index (BMI) 23.0-23.9, adult: Secondary | ICD-10-CM | POA: Diagnosis not present

## 2016-06-01 DIAGNOSIS — Z01419 Encounter for gynecological examination (general) (routine) without abnormal findings: Secondary | ICD-10-CM | POA: Diagnosis not present

## 2016-10-25 DIAGNOSIS — J019 Acute sinusitis, unspecified: Secondary | ICD-10-CM | POA: Diagnosis not present

## 2016-11-10 DIAGNOSIS — R0981 Nasal congestion: Secondary | ICD-10-CM | POA: Diagnosis not present

## 2016-11-30 DIAGNOSIS — J309 Allergic rhinitis, unspecified: Secondary | ICD-10-CM | POA: Diagnosis not present

## 2016-11-30 DIAGNOSIS — J0100 Acute maxillary sinusitis, unspecified: Secondary | ICD-10-CM | POA: Diagnosis not present

## 2016-11-30 DIAGNOSIS — J342 Deviated nasal septum: Secondary | ICD-10-CM | POA: Diagnosis not present

## 2016-11-30 DIAGNOSIS — R430 Anosmia: Secondary | ICD-10-CM | POA: Diagnosis not present

## 2017-02-07 DIAGNOSIS — E782 Mixed hyperlipidemia: Secondary | ICD-10-CM | POA: Diagnosis not present

## 2017-05-04 DIAGNOSIS — Z0000 Encounter for general adult medical examination without abnormal findings: Secondary | ICD-10-CM | POA: Diagnosis not present

## 2017-06-27 DIAGNOSIS — E782 Mixed hyperlipidemia: Secondary | ICD-10-CM | POA: Diagnosis not present

## 2017-07-05 DIAGNOSIS — Z1231 Encounter for screening mammogram for malignant neoplasm of breast: Secondary | ICD-10-CM | POA: Diagnosis not present

## 2017-07-05 DIAGNOSIS — Z6822 Body mass index (BMI) 22.0-22.9, adult: Secondary | ICD-10-CM | POA: Diagnosis not present

## 2017-07-05 DIAGNOSIS — Z01419 Encounter for gynecological examination (general) (routine) without abnormal findings: Secondary | ICD-10-CM | POA: Diagnosis not present

## 2017-09-21 IMAGING — MG MM DIAG BREAST TOMO UNI LEFT
4 series · 4 of 12 positions shown · non-contrast
Comparison: 04/29/2015 and earlier

CLINICAL DATA: Patient returns for evaluation of possible left
breast asymmetry.

EXAM:
DIGITAL DIAGNOSTIC LEFT MAMMOGRAM WITH 3D TOMOSYNTHESIS WITH CAD
ULTRASOUND LEFT BREAST

[L ML]
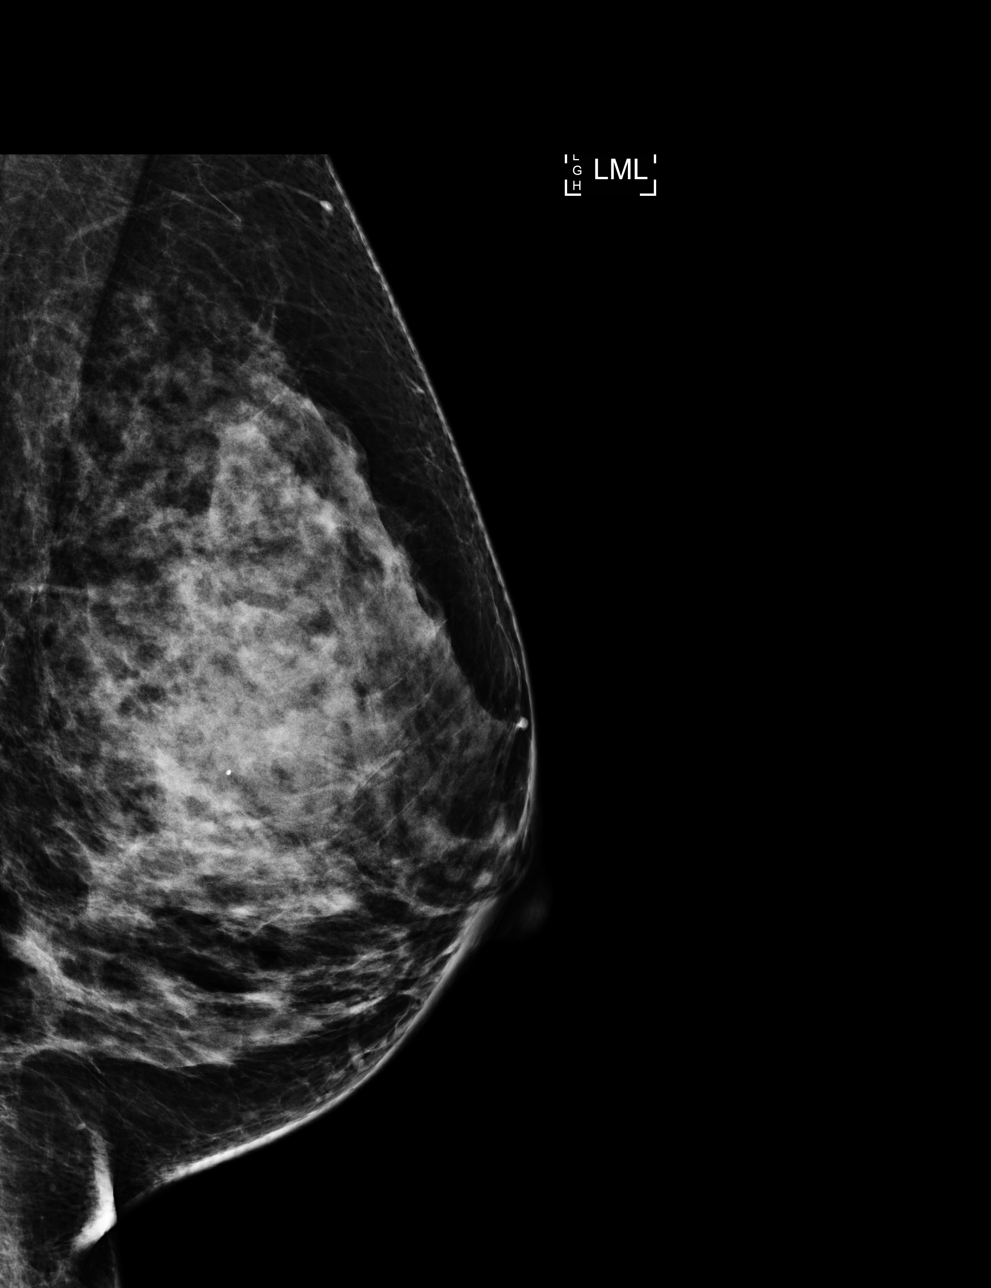

[L CC]
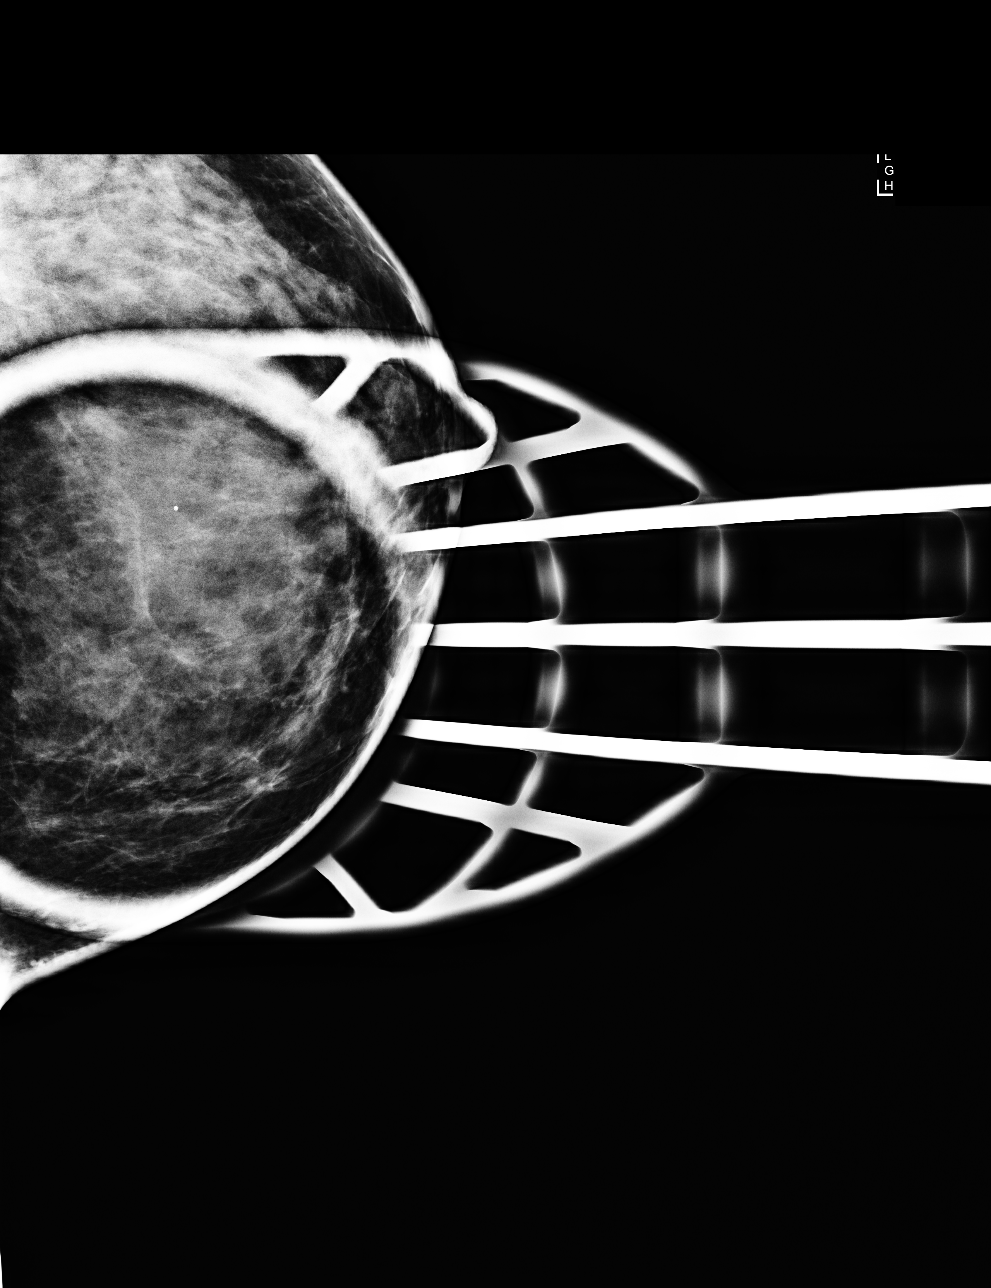

[L CC tomo · tomo slice 31/60.0]
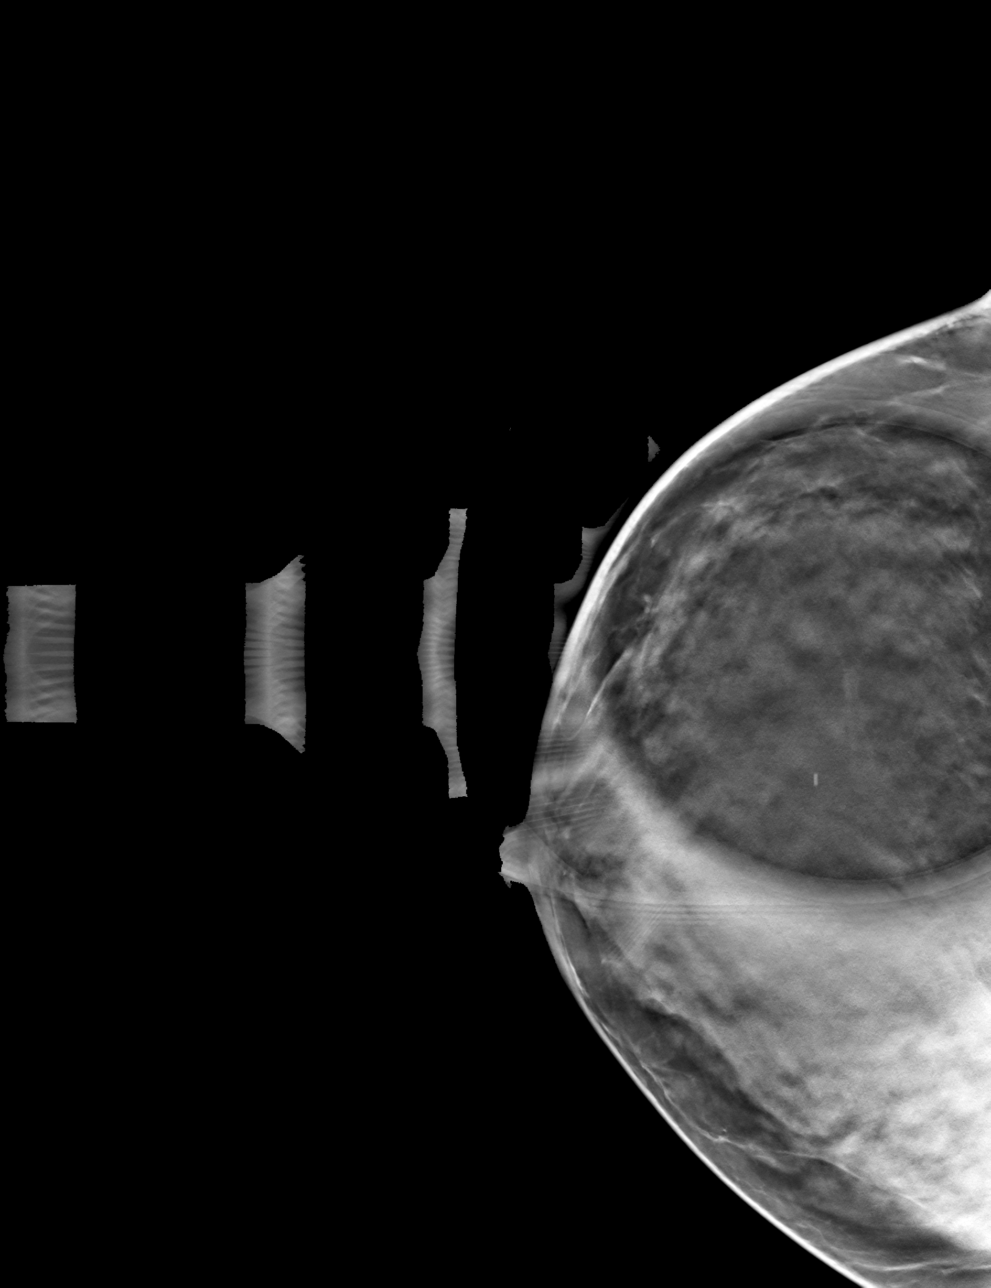

[L ML tomo · tomo slice 35/69.0]
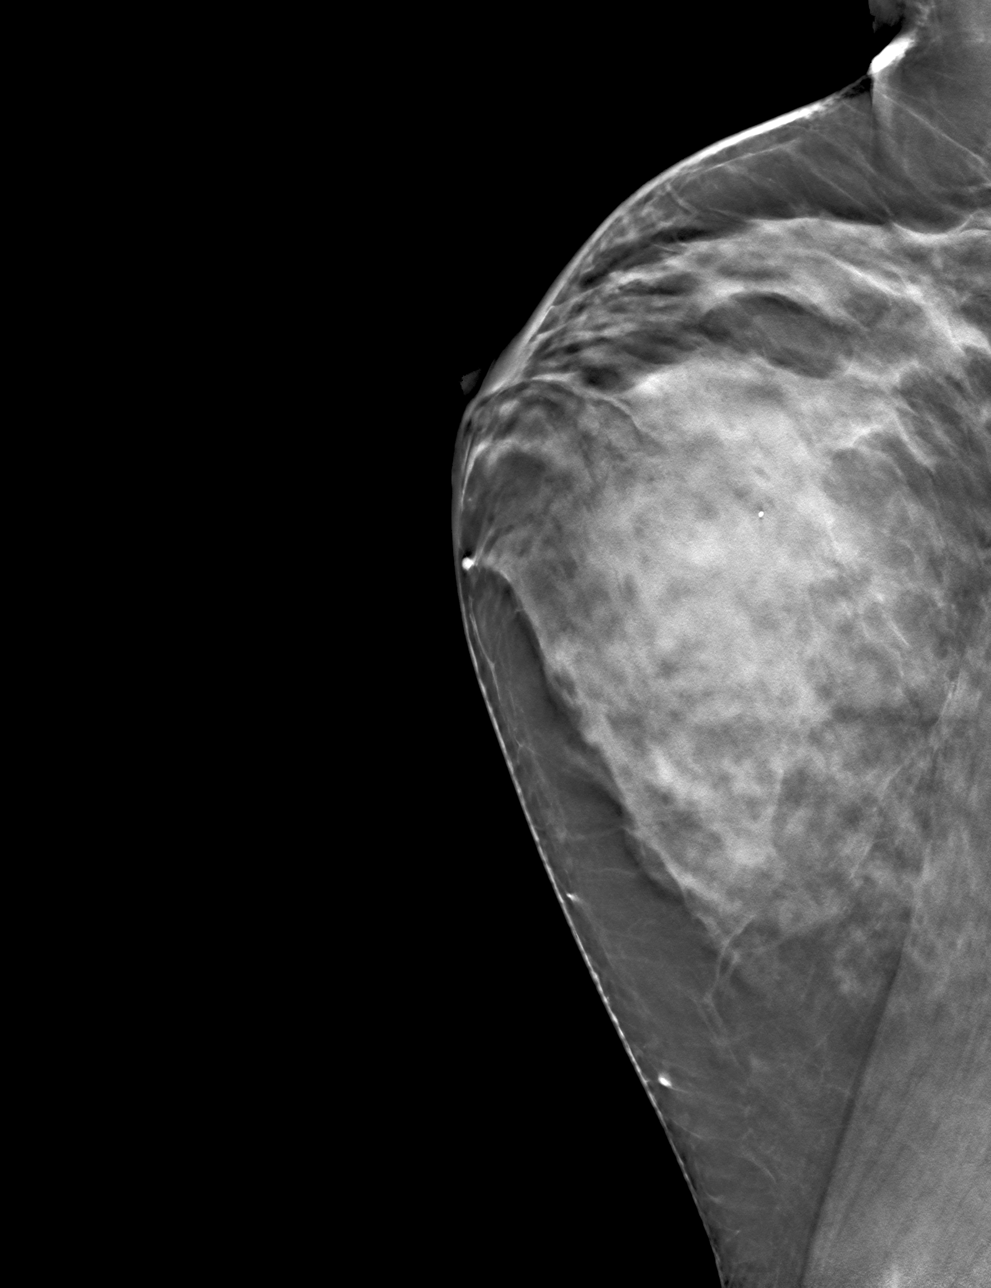

[4 of 12 positions shown; findings below may reference images not displayed]

ACR Breast Density Category d: The breast tissue is extremely dense,
which lowers the sensitivity of mammography.
FINDINGS: Additional views are performed, showing no persistent abnormality in
the medial aspect of the left breast.

Mammographic images were processed with CAD.

On physical exam, I palpate no abnormality in the medial quadrants
of the left breast.

Targeted ultrasound is performed, showing normal appearing, dense
fibroglandular tissue throughout the medial quadrants of the left
breast. No mass, distortion, or acoustic shadowing is demonstrated
with ultrasound.
IMPRESSION: No mammographic or ultrasound evidence for malignancy.

RECOMMENDATION:
Screening mammogram in one year.(Code:[id number])

I have discussed the findings and recommendations with the patient.
Results were also provided in writing at the conclusion of the
visit. If applicable, a reminder letter will be sent to the patient
regarding the next appointment.

BI-RADS CATEGORY  1: Negative.

## 2019-03-09 ENCOUNTER — Other Ambulatory Visit: Payer: Self-pay

## 2019-03-09 DIAGNOSIS — Z20822 Contact with and (suspected) exposure to covid-19: Secondary | ICD-10-CM

## 2019-03-11 LAB — NOVEL CORONAVIRUS, NAA: SARS-CoV-2, NAA: NOT DETECTED

## 2019-11-20 DIAGNOSIS — L237 Allergic contact dermatitis due to plants, except food: Secondary | ICD-10-CM | POA: Diagnosis not present

## 2019-12-05 DIAGNOSIS — L918 Other hypertrophic disorders of the skin: Secondary | ICD-10-CM | POA: Diagnosis not present

## 2019-12-05 DIAGNOSIS — D225 Melanocytic nevi of trunk: Secondary | ICD-10-CM | POA: Diagnosis not present

## 2019-12-05 DIAGNOSIS — L814 Other melanin hyperpigmentation: Secondary | ICD-10-CM | POA: Diagnosis not present

## 2019-12-05 DIAGNOSIS — D224 Melanocytic nevi of scalp and neck: Secondary | ICD-10-CM | POA: Diagnosis not present

## 2019-12-06 ENCOUNTER — Encounter: Payer: Self-pay | Admitting: Internal Medicine

## 2019-12-06 ENCOUNTER — Other Ambulatory Visit: Payer: Self-pay

## 2019-12-06 ENCOUNTER — Ambulatory Visit (INDEPENDENT_AMBULATORY_CARE_PROVIDER_SITE_OTHER): Payer: BC Managed Care – PPO | Admitting: Internal Medicine

## 2019-12-06 VITALS — BP 152/86 | HR 96 | Ht 63.0 in | Wt 134.2 lb

## 2019-12-06 DIAGNOSIS — E781 Pure hyperglyceridemia: Secondary | ICD-10-CM

## 2019-12-06 DIAGNOSIS — E785 Hyperlipidemia, unspecified: Secondary | ICD-10-CM | POA: Diagnosis not present

## 2019-12-06 DIAGNOSIS — Z8249 Family history of ischemic heart disease and other diseases of the circulatory system: Secondary | ICD-10-CM | POA: Diagnosis not present

## 2019-12-06 LAB — LIPID PANEL
Chol/HDL Ratio: 4.8 ratio — ABNORMAL HIGH (ref 0.0–4.4)
Cholesterol, Total: 139 mg/dL (ref 100–199)
HDL: 29 mg/dL — ABNORMAL LOW (ref >39)
LDL Chol Calc (NIH): 32 mg/dL (ref 0–99)
Triglycerides: 566 mg/dL (ref 0–149)
VLDL Cholesterol Cal: 78 mg/dL — ABNORMAL HIGH (ref 5–40)

## 2019-12-06 MED ORDER — ATORVASTATIN CALCIUM 10 MG PO TABS: 10.0000 mg | ORAL_TABLET | Freq: Every day | ORAL | 3 refills | Status: DC

## 2019-12-06 MED ORDER — FENOFIBRATE 145 MG PO TABS: 145.0000 mg | ORAL_TABLET | Freq: Every day | ORAL | 1 refills | Status: DC

## 2019-12-06 NOTE — Progress Notes (Signed)
LIPID CLINIC CONSULT NOTE  Chief Complaint:  High triglycerides  Primary Care Physician: Christine Small, MD  Primary Cardiologist:  No primary care provider on file.  HPI:  Christine Giles is a 54 y.o. female who is being seen today for the evaluation of high triglycerides at the request of Christine Small, MD.  This is a pleasant 54 year old female kindly referred by Dr. Valentina Giles for management of high triglycerides.  She reports probably 10 to 15 years of high triglycerides which were initially noted on insurance exam.  She says she is had difficulty controlling it on best medications and diet her triglycerides were as low as the mid 200s.  More recently her triglycerides had increased of over 500 and she was referred back for additional management options.  She actually asked to see a specialist regarding this.  She notes there is a heart disease history in her family.  Her father had heart disease starting in his 43s and bypass surgery and ultimately died of heart disease.  She is a remote smoker but quit many years ago.  She still drinks about 1-2 drinks per day.  This is picked up somewhat during the pandemic.  She works at a sedentary job and does not exercise much.  She says her diet is not as good as it was before the pandemic and may be contributing to her high triglycerides.  She is compliant with her medications.  Recently she had labs including total cholesterol 129, HDL 34, LDL of 23 and triglycerides 546.  Based on this her PCP decrease her atorvastatin to 20 mg 3 times a week.  She is also on gemfibrozil 600 mg twice daily and Vascepa 2 g twice daily.  She denies any cardiac symptoms such as chest pain, worsening shortness of breath, presyncope or syncopal symptoms or other associated cardiac history.  PMHx:  Past Medical History:  Diagnosis Date  . Anxiety   . Depression   . GERD (gastroesophageal reflux disease)   . Hyperlipemia   . Mucoid cyst of joint    right index  finger    Past Surgical History:  Procedure Laterality Date  . CESAREAN SECTION    . CYST REMOVAL TRUNK     back  . EAR CYST EXCISION Right 10/22/2014   Procedure: EXCISION MUCOID TUMOR ;  Surgeon: Cindee Salt, MD;  Location: [facility];  Service: Orthopedics;  Laterality: Right;  . ROTATION FLAP Right 10/22/2014   Procedure: ROTATION FLAP RIGHT INDEX FINGER ;  Surgeon: Cindee Salt, MD;  Location: [facility];  Service: Orthopedics;  Laterality: Right;    FAMHx:  Family History  Problem Relation Age of Onset  . CAD Father     SOCHx:   reports that she has quit smoking. She has never used smokeless tobacco. She reports current alcohol use. She reports that she does not use drugs.  ALLERGIES:  No Known Allergies  ROS: Pertinent items noted in HPI and remainder of comprehensive ROS otherwise negative.  HOME MEDS: Current Outpatient Medications on File Prior to Visit  Medication Sig Dispense Refill  . ALPRAZolam (XANAX) 0.5 MG tablet Take 0.5 mg by mouth as needed.    . fluticasone (FLONASE) 50 MCG/ACT nasal spray Place into the nose.    Marland Kitchen HYDROcodone-acetaminophen (NORCO) 5-325 MG per tablet Take 1 tablet by mouth every 6 (six) hours as needed for moderate pain. 30 tablet 0  . icosapent Ethyl (VASCEPA) 1 g capsule Vascepa 1 gram  capsule    . levonorgestrel-ethinyl estradiol (SEASONALE,INTROVALE,JOLESSA) 0.15-0.03 MG tablet Take 1 tablet by mouth daily.    Marland Kitchen omeprazole (PRILOSEC) 20 MG capsule Take 20 mg by mouth daily.    . sertraline (ZOLOFT) 25 MG tablet Take 25 mg by mouth daily.    Marland Kitchen zolpidem (AMBIEN) 10 MG tablet Take 10 mg by mouth at bedtime as needed.    . [DISCONTINUED] atorvastatin (LIPITOR) 20 MG tablet Take 20 mg by mouth 3 (three) times a week.     No current facility-administered medications on file prior to visit.    LABS/IMAGING: No results found for this or any previous visit (from the past 48 hour(s)). No results found.  LIPID  PANEL: No results found for: CHOL, TRIG, HDL, CHOLHDL, VLDL, LDLCALC, LDLDIRECT  WEIGHTS: Wt Readings from Last 3 Encounters:  12/06/19 134 lb 3.2 oz (60.9 kg)  10/22/14 127 lb (57.6 kg)    VITALS: BP (!) 152/86   Pulse 96   Ht 5\' 3"  (1.6 m)   Wt 134 lb 3.2 oz (60.9 kg)   SpO2 98%   BMI 23.77 kg/m   EXAM: General appearance: alert and no distress Neck: no carotid bruit, no JVD and thyroid not enlarged, symmetric, no tenderness/mass/nodules Lungs: clear to auscultation bilaterally Heart: regular rate and rhythm Abdomen: soft, non-tender; bowel sounds normal; no masses,  no organomegaly Extremities: extremities normal, atraumatic, no cyanosis or edema Pulses: 2+ and symmetric Skin: Skin color, texture, turgor normal. No rashes or lesions Neurologic: Grossly normal Psych: Pleasant  EKG: Deferred  ASSESSMENT: 1. Primary hypertriglyceridemia 2. Family history of early coronary disease  PLAN: 1.   [name] has primary hypertriglyceridemia which has been a longstanding issue.  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  She is currently on a good treatment regimen.  Recently her LDL was less than 25 and therefore primary decreased her atorvastatin.  I agree that she probably could use a little less however I would likely recommend decreasing the dose to 10 mg and remain on a daily basis due to the short half-life of the medication.  This would avoid days without any statin benefit.  A atorvastatin and gemfibrozil do increase the risk of myalgias.  I would prefer using fenofibrate with lower risk of this.  I have advised her to switch to fenofibrate 145 mg daily.  She should continue Vascepa 2 g twice daily which is an excellent medication.  We talked about dietary issues to also lower triglycerides and I provided her with management information from the [facility].  Her combination oral contraceptive pill may also be contributing to high triglycerides and if possible to  get off of that I would recommend it.  Also, decreasing her alcohol use will be helpful as well.  Thanks as always for the kind referral. Follow-up with me in 3 months with a repeat lipid at that time.  Christine Albee, MD, [facility], FACP  [place]  [facility]  Medical Director of the Advanced Lipid Disorders &  Cardiovascular Risk Reduction Clinic Diplomate of the American Board of Clinical Lipidology Attending Cardiologist  Direct Dial: [phone]  Fax: [phone]  Website:  [email or web address] Quanell Loughney 12/06/2019, 8:54 AM

## 2019-12-06 NOTE — Patient Instructions (Addendum)
Medication Instructions:  Your physician has recommended you make the following change in your medication:  1.  START Atorvastatin 10 mg taking 1 each night 2.  STOP Gemfibrozil 3.  START Fenofibrate 145 mg taking 1 daily 4.  STOP the Atorvastatin 20 mg   *If you need a refill on your cardiac medications before your next appointment, please call your pharmacy*   Lab Work: TODAY:  LIPID  3 MONTHS:  FASTING LIPID   If you have labs (blood work) drawn today and your tests are completely normal, you will receive your results only by: Marland Kitchen MyChart Message (if you have MyChart) OR . A paper copy in the mail If you have any lab test that is abnormal or we need to change your treatment, we will call you to review the results.   Testing/Procedures: Your physician recommends that you have a Calcium Score testing done.     Follow-Up: At [facility], you and your health needs are our priority.  As part of our continuing mission to provide you with exceptional heart care, we have created designated Provider Care Teams.  These Care Teams include your primary Cardiologist (physician) and Advanced Practice Providers (APPs -  Physician Assistants and Nurse Practitioners) who all work together to provide you with the care you need, when you need it.  We recommend signing up for the patient portal called "MyChart".  Sign up information is provided on this After Visit Summary.  MyChart is used to connect with patients for Virtual Visits (Telemedicine).  Patients are able to view lab/test results, encounter notes, upcoming appointments, etc.  Non-urgent messages can be sent to your provider as well.   To learn more about what you can do with MyChart, go to [email or web address].    Your next appointment:   3 month(s)  The format for your next appointment:   In Person  Provider:   K. [place] Hilty, MD   Other Instructions

## 2019-12-17 ENCOUNTER — Telehealth: Payer: Self-pay

## 2019-12-17 NOTE — Telephone Encounter (Signed)
Pt calling stating that her pharmacy stated that her insurance will not cover fenofibrate 145 mg, but her insurance will cover fenofibrate 134 mg. Pharmacy stated that the doctor can do a prior auth for the 145 mg or change medication to fenofibrate 134 mg. Pt would like a call back concerning this matter. Please address

## 2019-12-18 MED ORDER — FENOFIBRATE MICRONIZED 134 MG PO CAPS: 134.0000 mg | ORAL_CAPSULE | Freq: Every day | ORAL | 3 refills | Status: DC

## 2019-12-18 NOTE — Telephone Encounter (Signed)
Ok to switch 134 mg fenofibrate.  Dr Rexene Edison

## 2019-12-18 NOTE — Telephone Encounter (Signed)
Rx sent to pharmacy. Message sent to patient

## 2019-12-19 DIAGNOSIS — Z1231 Encounter for screening mammogram for malignant neoplasm of breast: Secondary | ICD-10-CM | POA: Diagnosis not present

## 2019-12-19 DIAGNOSIS — Z6823 Body mass index (BMI) 23.0-23.9, adult: Secondary | ICD-10-CM | POA: Diagnosis not present

## 2019-12-19 DIAGNOSIS — Z01419 Encounter for gynecological examination (general) (routine) without abnormal findings: Secondary | ICD-10-CM | POA: Diagnosis not present

## 2020-01-01 ENCOUNTER — Other Ambulatory Visit: Payer: Self-pay

## 2020-01-01 ENCOUNTER — Ambulatory Visit
Admission: RE | Admit: 2020-01-01 | Discharge: 2020-01-01 | Disposition: A | Discharge status: Home / Self Care | Payer: Self-pay | Source: Ambulatory Visit | Attending: Internal Medicine | Admitting: Internal Medicine

## 2020-01-01 DIAGNOSIS — E781 Pure hyperglyceridemia: Secondary | ICD-10-CM

## 2020-03-07 ENCOUNTER — Ambulatory Visit: Payer: BC Managed Care – PPO | Admitting: Internal Medicine

## 2020-03-07 ENCOUNTER — Other Ambulatory Visit: Payer: Self-pay

## 2020-03-07 DIAGNOSIS — E781 Pure hyperglyceridemia: Secondary | ICD-10-CM

## 2020-03-07 DIAGNOSIS — E559 Vitamin D deficiency, unspecified: Secondary | ICD-10-CM | POA: Diagnosis not present

## 2020-03-07 LAB — LIPID PANEL
Chol/HDL Ratio: 5.7 ratio — ABNORMAL HIGH (ref 0.0–4.4)
Cholesterol, Total: 113 mg/dL (ref 100–199)
HDL: 20 mg/dL — ABNORMAL LOW (ref >39)
LDL Chol Calc (NIH): 25 mg/dL (ref 0–99)
Triglycerides: 497 mg/dL — ABNORMAL HIGH (ref 0–149)
VLDL Cholesterol Cal: 68 mg/dL — ABNORMAL HIGH (ref 5–40)

## 2020-03-11 ENCOUNTER — Telehealth (INDEPENDENT_AMBULATORY_CARE_PROVIDER_SITE_OTHER): Payer: BC Managed Care – PPO | Admitting: Internal Medicine

## 2020-03-11 ENCOUNTER — Encounter: Payer: Self-pay | Admitting: Internal Medicine

## 2020-03-11 VITALS — Ht 63.0 in | Wt 128.0 lb

## 2020-03-11 DIAGNOSIS — E781 Pure hyperglyceridemia: Secondary | ICD-10-CM | POA: Diagnosis not present

## 2020-03-11 DIAGNOSIS — Z8249 Family history of ischemic heart disease and other diseases of the circulatory system: Secondary | ICD-10-CM

## 2020-03-11 NOTE — Patient Instructions (Signed)
Medication Instructions:  STOP atorvastatin   *If you need a refill on your cardiac medications before your next appointment, please call your pharmacy*   Lab Work: We will contact [facility] to see about adding on a TSH, Vit D level   FASTING lab work in 3 months - complete before your next visit   If you have labs (blood work) drawn today and your tests are completely normal, you will receive your results only by: Marland Kitchen MyChart Message (if you have MyChart) OR . A paper copy in the mail If you have any lab test that is abnormal or we need to change your treatment, we will call you to review the results.   Testing/Procedures: NONE   Follow-Up: At [facility], you and your health needs are our priority.  As part of our continuing mission to provide you with exceptional heart care, we have created designated Provider Care Teams.  These Care Teams include your primary Cardiologist (physician) and Advanced Practice Providers (APPs -  Physician Assistants and Nurse Practitioners) who all work together to provide you with the care you need, when you need it.  We recommend signing up for the patient portal called "MyChart".  Sign up information is provided on this After Visit Summary.  MyChart is used to connect with patients for Virtual Visits (Telemedicine).  Patients are able to view lab/test results, encounter notes, upcoming appointments, etc.  Non-urgent messages can be sent to your provider as well.   To learn more about what you can do with MyChart, go to [email or web address].    Your next appointment:   3 month(s)  The format for your next appointment:   In Person  Provider:   Dr. Zoila Shutter - lipid clinic   Other Instructions

## 2020-03-11 NOTE — Progress Notes (Signed)
Virtual Visit via Telephone Note   This visit type was conducted due to national recommendations for restrictions regarding the COVID-19 Pandemic (e.g. social distancing) in an effort to limit this patient's exposure and mitigate transmission in our community.  Due to her co-morbid illnesses, this patient is at least at moderate risk for complications without adequate follow up.  This format is felt to be most appropriate for this patient at this time.  The patient did not have access to video technology/had technical difficulties with video requiring transitioning to audio format only (telephone).  All issues noted in this document were discussed and addressed.  No physical exam could be performed with this format.  Please refer to the patient's chart for her  consent to telehealth for [facility].   Date:  03/11/2020   ID:  Christine Giles, DOB [birth year], MRN [id number] The patient was identified using 2 identifiers.  Evaluation Performed:  Follow-Up Visit  Patient Location:  [address] [place] [address]  Provider location:   [address], [address] [place], [place] [address]  PCP:  Maurice Small, MD  Cardiologist:  No primary care provider on file. Electrophysiologist:  None   Chief Complaint: Follow-up dyslipidemia  History of Present Illness:    Christine Giles is a 54 y.o. female who presents via audio/video conferencing for a telehealth visit today.  This is a pleasant 54 year old female who is here today for telephone follow-up of dyslipidemia, primarily high triglycerides.  Previously based on her low LDL cholesterol I recommended decreasing her statin and adding fenofibrate to her Vascepa.  Her triglycerides have been as high as the 900s and improved to just over 500.  Repeat lipids were recently performed which demonstrated further reduction in total cholesterol from 139-113.  Triglycerides were decreased from 566-497 but her HDL cholesterol is lower at 20  and her LDL is also lower at 25.  These findings are certainly not explained by lowering her statin or adding fenofibrate.  It seems that also she has started more physical activity in fact she is exercising on a daily basis which is new for her.  She certainly feels better with this and I suspect this is lowered her cholesterol more significantly.  At this point with her LDL being quite low, we discussed management options.  Fortunately she had a recent coronary calcium score which showed no coronary calcium therefore I am not as concerned about this dyslipidemia at this point.  The patient does not have symptoms concerning for COVID-19 infection (fever, chills, cough, or new SHORTNESS OF BREATH).    Prior CV studies:   The following studies were reviewed today:  Chart reviewed  PMHx:  Past Medical History:  Diagnosis Date  . Anxiety   . Depression   . GERD (gastroesophageal reflux disease)   . Hyperlipemia   . Mucoid cyst of joint    right index finger    Past Surgical History:  Procedure Laterality Date  . CESAREAN SECTION    . CYST REMOVAL TRUNK     back  . EAR CYST EXCISION Right 10/22/2014   Procedure: EXCISION MUCOID TUMOR ;  Surgeon: Cindee Salt, MD;  Location: [facility];  Service: Orthopedics;  Laterality: Right;  . ROTATION FLAP Right 10/22/2014   Procedure: ROTATION FLAP RIGHT INDEX FINGER ;  Surgeon: Cindee Salt, MD;  Location: [facility];  Service: Orthopedics;  Laterality: Right;    FAMHx:  Family History  Problem Relation Age of  Onset  . CAD Father     SOCHx:   reports that she has quit smoking. She has never used smokeless tobacco. She reports current alcohol use. She reports that she does not use drugs.  ALLERGIES:  No Known Allergies  MEDS:  Current Meds  Medication Sig  . ALPRAZolam (XANAX) 0.5 MG tablet Take 0.5 mg by mouth as needed.  . fenofibrate micronized (LOFIBRA) 134 MG capsule Take 1 capsule (134 mg total) by  mouth daily before breakfast.  . fluticasone (FLONASE) 50 MCG/ACT nasal spray Place into the nose.  . icosapent Ethyl (VASCEPA) 1 g capsule Vascepa 1 gram capsule  . levonorgestrel-ethinyl estradiol (SEASONALE,INTROVALE,JOLESSA) 0.15-0.03 MG tablet Take 1 tablet by mouth daily.  Marland Kitchen omeprazole (PRILOSEC) 20 MG capsule Take 20 mg by mouth daily.  . sertraline (ZOLOFT) 25 MG tablet Take 25 mg by mouth daily.  Marland Kitchen zolpidem (AMBIEN) 10 MG tablet Take 10 mg by mouth at bedtime as needed.     ROS: Pertinent items noted in HPI and remainder of comprehensive ROS otherwise negative.  Labs/Other Tests and Data Reviewed:    Recent Labs: No results found for requested labs within last 8760 hours.   Recent Lipid Panel Lab Results  Component Value Date/Time   CHOL 113 03/07/2020 08:22 AM   TRIG 497 (H) 03/07/2020 08:22 AM   HDL 20 (L) 03/07/2020 08:22 AM   CHOLHDL 5.7 (H) 03/07/2020 08:22 AM   LDLCALC 25 03/07/2020 08:22 AM    Wt Readings from Last 3 Encounters:  03/11/20 128 lb (58.1 kg)  12/06/19 134 lb 3.2 oz (60.9 kg)  10/22/14 127 lb (57.6 kg)     Exam:    Vital Signs:  Ht 5\' 3"  (1.6 m)   Wt 128 lb (58.1 kg)   BMI 22.67 kg/m    Exam not performed due to telephone visit  ASSESSMENT & PLAN:    1. Primary hypertriglyceridemia 2. Family history of early coronary disease  Christine Giles has had further changes in her lipid profile I think mostly related to exercise.  I have encouraged this as she is feeling better and is also about 6 pounds lighter than we saw her in September.  At this point I would discontinue her statin because of her low LDL cholesterol.  This may result in a small increase in her triglycerides.  She should continue with the fibrate and Vascepa.  I would continue with the dietary modifications.  In addition we need to continue to try to reduce saturated fats in her diet.  She is not actively pursued this but I have encouraged her to look at the content of the foods that  she is eating and try to reduce fats.  This should really help with her triglycerides.  We will plan repeat labs in about 3 months and follow-up at that time.  We will also check TSH and vitamin D levels.  COVID-19 Education: The signs and symptoms of COVID-19 were discussed with the patient and how to seek care for testing (follow up with PCP or arrange E-visit).  The importance of social distancing was discussed today.  Patient Risk:   After full review of this patients clinical status, I feel that they are at least moderate risk at this time.  Time:   Today, I have spent 25 minutes with the patient with telehealth technology discussing dyslipidemia.     Medication Adjustments/Labs and Tests Ordered: Current medicines are reviewed at length with the patient today.  Concerns regarding  medicines are outlined above.   Tests Ordered: Orders Placed This Encounter  Procedures  . TSH  . Vitamin D (25 hydroxy)  . Lipid panel    Medication Changes: No orders of the defined types were placed in this encounter.   Disposition:  in 3 month(s)  Chrystie Nose, MD, [facility], FACP  [place]  [facility]  Medical Director of the Advanced Lipid Disorders &  Cardiovascular Risk Reduction Clinic Diplomate of the American Board of Clinical Lipidology Attending Cardiologist  Direct Dial: [phone]  Fax: [phone]  Website:  [email or web address]  Chrystie Nose, MD  03/11/2020 9:38 AM

## 2020-03-12 ENCOUNTER — Other Ambulatory Visit: Payer: Self-pay | Admitting: *Deleted

## 2020-03-12 DIAGNOSIS — E559 Vitamin D deficiency, unspecified: Secondary | ICD-10-CM

## 2020-03-12 MED ORDER — VITAMIN D (ERGOCALCIFEROL) 1.25 MG (50000 UNIT) PO CAPS: 50000.0000 [IU] | ORAL_CAPSULE | ORAL | 0 refills | Status: DC

## 2020-04-02 LAB — TSH: TSH: 4.19 u[IU]/mL (ref 0.450–4.500)

## 2020-04-02 LAB — SPECIMEN STATUS REPORT

## 2020-04-02 LAB — VITAMIN D 25 HYDROXY (VIT D DEFICIENCY, FRACTURES): Vit D, 25-Hydroxy: 11.3 ng/mL — ABNORMAL LOW (ref 30.0–100.0)

## 2020-05-19 DIAGNOSIS — B009 Herpesviral infection, unspecified: Secondary | ICD-10-CM | POA: Diagnosis not present

## 2020-05-30 DIAGNOSIS — A609 Anogenital herpesviral infection, unspecified: Secondary | ICD-10-CM | POA: Diagnosis not present

## 2020-06-05 DIAGNOSIS — Z139 Encounter for screening, unspecified: Secondary | ICD-10-CM | POA: Diagnosis not present

## 2020-06-20 ENCOUNTER — Encounter: Payer: Self-pay | Admitting: Internal Medicine

## 2020-06-20 ENCOUNTER — Telehealth: Payer: BC Managed Care – PPO | Admitting: Internal Medicine

## 2020-06-20 DIAGNOSIS — E559 Vitamin D deficiency, unspecified: Secondary | ICD-10-CM | POA: Diagnosis not present

## 2020-06-20 DIAGNOSIS — E781 Pure hyperglyceridemia: Secondary | ICD-10-CM | POA: Diagnosis not present

## 2020-06-20 LAB — LIPID PANEL
Chol/HDL Ratio: 6.7 ratio — ABNORMAL HIGH (ref 0.0–4.4)
Cholesterol, Total: 167 mg/dL (ref 100–199)
HDL: 25 mg/dL — ABNORMAL LOW (ref >39)
LDL Chol Calc (NIH): 48 mg/dL (ref 0–99)
Triglycerides: 656 mg/dL (ref 0–149)
VLDL Cholesterol Cal: 94 mg/dL — ABNORMAL HIGH (ref 5–40)

## 2020-06-21 LAB — VITAMIN D 25 HYDROXY (VIT D DEFICIENCY, FRACTURES): Vit D, 25-Hydroxy: 33.1 ng/mL (ref 30.0–100.0)

## 2020-07-02 ENCOUNTER — Telehealth (INDEPENDENT_AMBULATORY_CARE_PROVIDER_SITE_OTHER): Payer: BC Managed Care – PPO | Admitting: Internal Medicine

## 2020-07-02 ENCOUNTER — Encounter: Payer: Self-pay | Admitting: Internal Medicine

## 2020-07-02 VITALS — Wt 131.0 lb

## 2020-07-02 DIAGNOSIS — E781 Pure hyperglyceridemia: Secondary | ICD-10-CM | POA: Diagnosis not present

## 2020-07-02 DIAGNOSIS — E559 Vitamin D deficiency, unspecified: Secondary | ICD-10-CM

## 2020-07-02 DIAGNOSIS — Z8249 Family history of ischemic heart disease and other diseases of the circulatory system: Secondary | ICD-10-CM | POA: Diagnosis not present

## 2020-07-02 MED ORDER — ATORVASTATIN CALCIUM 10 MG PO TABS: 10.0000 mg | ORAL_TABLET | Freq: Every day | ORAL | 3 refills | Status: DC

## 2020-07-02 NOTE — Patient Instructions (Addendum)
Medication Instructions:  START atorvastatin 10mg  daily at nighttime  Continue all other current medications  *If you need a refill on your cardiac medications before your next appointment, please call your pharmacy*   Lab Work: FASTING lab work in 6 months - lipid panel, vitamin D  If you have labs (blood work) drawn today and your tests are completely normal, you will receive your results only by: MyChart Message (if you have MyChart) OR . A paper copy in the mail If you have any lab test that is abnormal or we need to change your treatment, we will call you to review the results.   Testing/Procedures: NONE   Follow-Up: At [facility], you and your health needs are our priority.  As part of our continuing mission to provide you with exceptional heart care, we have created designated Provider Care Teams.  These Care Teams include your primary Cardiologist (physician) and Advanced Practice Providers (APPs -  Physician Assistants and Nurse Practitioners) who all work together to provide you with the care you need, when you need it.  We recommend signing up for the patient portal called "MyChart".  Sign up information is provided on this After Visit Summary.  MyChart is used to connect with patients for Virtual Visits (Telemedicine).  Patients are able to view lab/test results, encounter notes, upcoming appointments, etc.  Non-urgent messages can be sent to your provider as well.   To learn more about what you can do with MyChart, go to [facility].    Your next appointment:   6 month(s) - lipid clinic  The format for your next appointment:   In Person or Virtual  Provider:   [email or web address]. Kirtland Bouchard Hilty

## 2020-07-02 NOTE — Progress Notes (Signed)
Virtual Visit via Telephone Note   This visit type was conducted due to national recommendations for restrictions regarding the COVID-19 Pandemic (e.g. social distancing) in an effort to limit this patient's exposure and mitigate transmission in our community.  Due to her co-morbid illnesses, this patient is at least at moderate risk for complications without adequate follow up.  This format is felt to be most appropriate for this patient at this time.  The patient did not have access to video technology/had technical difficulties with video requiring transitioning to audio format only (telephone).  All issues noted in this document were discussed and addressed.  No physical exam could be performed with this format.  Please refer to the patient's chart for her  consent to telehealth for [facility].   Date:  07/02/2020   ID:  Christine Giles, DOB 04/27/1965, MRN [id number] The patient was identified using 2 identifiers.  Evaluation Performed:  Follow-Up Visit  Patient Location:  [address] [place] [address]  Provider location:   [address] [place], [place] [address]  PCP:  Maurice Small, MD  Cardiologist:  No primary care provider on file. Electrophysiologist:  None   Chief Complaint: Follow-up dyslipidemia  History of Present Illness:    Christine Giles is a 55 y.o. female who presents via audio/video conferencing for a telehealth visit today.  This is a pleasant 55 year old female who is here today for telephone follow-up of dyslipidemia, primarily high triglycerides.  Previously based on her low LDL cholesterol I recommended decreasing her statin and adding fenofibrate to her Vascepa.  Her triglycerides have been as high as the 900s and improved to just over 500.  Repeat lipids were recently performed which demonstrated further reduction in total cholesterol from 139-113.  Triglycerides were decreased from 566-497 but her HDL cholesterol is lower at 20  and her LDL is also lower at 25.  These findings are certainly not explained by lowering her statin or adding fenofibrate.  It seems that also she has started more physical activity in fact she is exercising on a daily basis which is new for her.  She certainly feels better with this and I suspect this is lowered her cholesterol more significantly.  At this point with her LDL being quite low, we discussed management options.  Fortunately she had a recent coronary calcium score which showed no coronary calcium therefore I am not as concerned about this dyslipidemia at this point.  07/02/2020  [name] returns today for virtual follow-up.  She has been on the Vascepa as well as fenofibrate 134 mg and previously was on atorvastatin which I had reduced to 10 mg daily.  This medicine was stopped due to a very low LDL cholesterol however unfortunately her repeat lipids show an increase in her triglycerides which is not unexpected.  Is now showed total cholesterol 167, triglycerides 656, HDL 25 and LDL 48.  Her numbers seem to be somewhat better on the atorvastatin 10 mg daily and we discussed restarting that.  Of note also her vitamin D level was severely low at 11.3 ng/mL, but has successfully been repleted now up to 33.1 ng/mL which is in the normal range.  I advised her to remain on 2000 IU daily vitamin D.  The patient does not have symptoms concerning for COVID-19 infection (fever, chills, cough, or new SHORTNESS OF BREATH).    Prior CV studies:   The following studies were reviewed today:  Chart reviewed  PMHx:  Past Medical History:  Diagnosis Date  . Anxiety   . Depression   . GERD (gastroesophageal reflux disease)   . Hyperlipemia   . Mucoid cyst of joint    right index finger    Past Surgical History:  Procedure Laterality Date  . CESAREAN SECTION    . CYST REMOVAL TRUNK     back  . EAR CYST EXCISION Right 10/22/2014   Procedure: EXCISION MUCOID TUMOR ;  Surgeon: Cindee Salt, MD;   Location: [facility];  Service: Orthopedics;  Laterality: Right;  . ROTATION FLAP Right 10/22/2014   Procedure: ROTATION FLAP RIGHT INDEX FINGER ;  Surgeon: Cindee Salt, MD;  Location: [facility];  Service: Orthopedics;  Laterality: Right;    FAMHx:  Family History  Problem Relation Age of Onset  . CAD Father     SOCHx:   reports that she has quit smoking. She has never used smokeless tobacco. She reports current alcohol use. She reports that she does not use drugs.  ALLERGIES:  No Known Allergies  MEDS:  Current Meds  Medication Sig  . ALPRAZolam (XANAX) 0.5 MG tablet Take 0.5 mg by mouth as needed.  . Cholecalciferol (VITAMIN D3) 25 MCG (1000 UT) CAPS Take 2,000 Units by mouth daily.  . fenofibrate micronized (LOFIBRA) 134 MG capsule Take 1 capsule (134 mg total) by mouth daily before breakfast.  . fluticasone (FLONASE) 50 MCG/ACT nasal spray Place into the nose as needed.  Marland Kitchen icosapent Ethyl (VASCEPA) 1 g capsule Take 2 g by mouth 2 (two) times daily.  Marland Kitchen levonorgestrel-ethinyl estradiol (SEASONALE,INTROVALE,JOLESSA) 0.15-0.03 MG tablet Take 1 tablet by mouth daily.  Marland Kitchen omeprazole (PRILOSEC) 20 MG capsule Take 20 mg by mouth daily.  . sertraline (ZOLOFT) 25 MG tablet Take 25 mg by mouth daily.  Marland Kitchen zolpidem (AMBIEN) 10 MG tablet Take 10 mg by mouth at bedtime as needed.     ROS: Pertinent items noted in HPI and remainder of comprehensive ROS otherwise negative.  Labs/Other Tests and Data Reviewed:    Recent Labs: 03/07/2020: TSH 4.190   Recent Lipid Panel Lab Results  Component Value Date/Time   CHOL 167 06/20/2020 08:54 AM   TRIG 656 (HH) 06/20/2020 08:54 AM   HDL 25 (L) 06/20/2020 08:54 AM   CHOLHDL 6.7 (H) 06/20/2020 08:54 AM   LDLCALC 48 06/20/2020 08:54 AM    Wt Readings from Last 3 Encounters:  07/02/20 131 lb (59.4 kg)  03/11/20 128 lb (58.1 kg)  12/06/19 134 lb 3.2 oz (60.9 kg)     Exam:    Vital Signs:  Wt 131 lb (59.4 kg)    BMI 23.21 kg/m    Exam not performed due to telephone visit  ASSESSMENT & PLAN:    1. Primary hypertriglyceridemia 2. Vitamin D deficiency 3. Family history of early coronary disease  Ms. Dula has persistently elevated triglycerides.  We discussed the possibility of genetic testing which we may want to evaluate at some point.  It could be helpful to identify her mutation particularly if it is an identified mutation that is associated with a lower risk.  This could also be used to screen her son.  Her vitamin D levels are now established.  I advised her to go back on the atorvastatin 10 mg daily as it did help her triglycerides.  Unfortunately she would not be a candidate for any of the newer therapies for triglyceride lowering since they are targeting patients with known coronary artery disease and  prior cardiovascular events.  Plan follow-up with repeat lipids in 6 months.  COVID-19 Education: The signs and symptoms of COVID-19 were discussed with the patient and how to seek care for testing (follow up with PCP or arrange E-visit).  The importance of social distancing was discussed today.  Patient Risk:   After full review of this patients clinical status, I feel that they are at least moderate risk at this time.  Time:   Today, I have spent 25 minutes with the patient with telehealth technology discussing dyslipidemia.     Medication Adjustments/Labs and Tests Ordered: Current medicines are reviewed at length with the patient today.  Concerns regarding medicines are outlined above.   Tests Ordered: No orders of the defined types were placed in this encounter.   Medication Changes: No orders of the defined types were placed in this encounter.   Disposition:  in 6 month(s)  Chrystie Nose, MD, [facility], FACP  [place]  [facility]  Medical Director of the Advanced Lipid Disorders &  Cardiovascular Risk Reduction Clinic Diplomate of the American Board of Clinical  Lipidology Attending Cardiologist  Direct Dial: [phone]  Fax: [phone]  Website:  [email or web address]  Chrystie Nose, MD  07/02/2020 8:08 AM

## 2020-12-12 DIAGNOSIS — Z0000 Encounter for general adult medical examination without abnormal findings: Secondary | ICD-10-CM | POA: Diagnosis not present

## 2020-12-12 DIAGNOSIS — E782 Mixed hyperlipidemia: Secondary | ICD-10-CM | POA: Diagnosis not present

## 2020-12-16 DIAGNOSIS — E782 Mixed hyperlipidemia: Secondary | ICD-10-CM | POA: Diagnosis not present

## 2020-12-16 DIAGNOSIS — K219 Gastro-esophageal reflux disease without esophagitis: Secondary | ICD-10-CM | POA: Diagnosis not present

## 2020-12-16 DIAGNOSIS — F39 Unspecified mood [affective] disorder: Secondary | ICD-10-CM | POA: Diagnosis not present

## 2020-12-16 DIAGNOSIS — Z0000 Encounter for general adult medical examination without abnormal findings: Secondary | ICD-10-CM | POA: Diagnosis not present

## 2020-12-16 DIAGNOSIS — J45998 Other asthma: Secondary | ICD-10-CM | POA: Diagnosis not present

## 2020-12-27 ENCOUNTER — Other Ambulatory Visit: Payer: Self-pay | Admitting: Internal Medicine

## 2021-03-03 DIAGNOSIS — Z1231 Encounter for screening mammogram for malignant neoplasm of breast: Secondary | ICD-10-CM | POA: Diagnosis not present

## 2021-03-03 DIAGNOSIS — Z6823 Body mass index (BMI) 23.0-23.9, adult: Secondary | ICD-10-CM | POA: Diagnosis not present

## 2021-03-03 DIAGNOSIS — Z01419 Encounter for gynecological examination (general) (routine) without abnormal findings: Secondary | ICD-10-CM | POA: Diagnosis not present

## 2021-05-28 DIAGNOSIS — L570 Actinic keratosis: Secondary | ICD-10-CM | POA: Diagnosis not present

## 2021-05-28 DIAGNOSIS — D225 Melanocytic nevi of trunk: Secondary | ICD-10-CM | POA: Diagnosis not present

## 2021-05-28 DIAGNOSIS — L920 Granuloma annulare: Secondary | ICD-10-CM | POA: Diagnosis not present

## 2021-05-28 DIAGNOSIS — D2271 Melanocytic nevi of right lower limb, including hip: Secondary | ICD-10-CM | POA: Diagnosis not present

## 2021-05-28 DIAGNOSIS — L821 Other seborrheic keratosis: Secondary | ICD-10-CM | POA: Diagnosis not present

## 2021-05-28 DIAGNOSIS — L918 Other hypertrophic disorders of the skin: Secondary | ICD-10-CM | POA: Diagnosis not present

## 2021-07-14 DIAGNOSIS — Z1211 Encounter for screening for malignant neoplasm of colon: Secondary | ICD-10-CM | POA: Diagnosis not present

## 2021-07-22 LAB — COLOGUARD: COLOGUARD: NEGATIVE

## 2021-11-10 ENCOUNTER — Telehealth: Payer: Self-pay | Admitting: Internal Medicine

## 2021-11-10 NOTE — Telephone Encounter (Signed)
*  STAT* If patient is at the pharmacy, call can be transferred to refill team.   1. Which medications need to be refilled? (please list name of each medication and dose if known) fenofibrate micronized (LOFIBRA) 134 MG capsule  2. Which pharmacy/location (including street and city if local pharmacy) is medication to be sent to?  [id number] - [place], [place] - [address] 3. Do they need a 30 day or 90 day supply?  90   Pt made an appt for 03/02/22 with Dr. Rennis Golden

## 2021-11-11 MED ORDER — FENOFIBRATE MICRONIZED 134 MG PO CAPS: 134.0000 mg | ORAL_CAPSULE | Freq: Every day | ORAL | 0 refills | Status: DC

## 2021-11-23 DIAGNOSIS — M67442 Ganglion, left hand: Secondary | ICD-10-CM | POA: Diagnosis not present

## 2021-11-23 DIAGNOSIS — R2232 Localized swelling, mass and lump, left upper limb: Secondary | ICD-10-CM | POA: Diagnosis not present

## 2021-11-23 DIAGNOSIS — M79674 Pain in right toe(s): Secondary | ICD-10-CM | POA: Diagnosis not present

## 2021-12-22 DIAGNOSIS — Z23 Encounter for immunization: Secondary | ICD-10-CM | POA: Diagnosis not present

## 2021-12-22 DIAGNOSIS — Z0000 Encounter for general adult medical examination without abnormal findings: Secondary | ICD-10-CM | POA: Diagnosis not present

## 2022-02-04 ENCOUNTER — Other Ambulatory Visit: Payer: Self-pay | Admitting: Internal Medicine

## 2022-02-11 DIAGNOSIS — J069 Acute upper respiratory infection, unspecified: Secondary | ICD-10-CM | POA: Diagnosis not present

## 2022-02-11 DIAGNOSIS — Z23 Encounter for immunization: Secondary | ICD-10-CM | POA: Diagnosis not present

## 2022-02-11 DIAGNOSIS — Z6823 Body mass index (BMI) 23.0-23.9, adult: Secondary | ICD-10-CM | POA: Diagnosis not present

## 2022-02-15 DIAGNOSIS — R051 Acute cough: Secondary | ICD-10-CM | POA: Diagnosis not present

## 2022-02-15 DIAGNOSIS — J0140 Acute pansinusitis, unspecified: Secondary | ICD-10-CM | POA: Diagnosis not present

## 2022-02-15 DIAGNOSIS — R030 Elevated blood-pressure reading, without diagnosis of hypertension: Secondary | ICD-10-CM | POA: Diagnosis not present

## 2022-02-15 DIAGNOSIS — B379 Candidiasis, unspecified: Secondary | ICD-10-CM | POA: Diagnosis not present

## 2022-03-02 ENCOUNTER — Ambulatory Visit: Payer: BC Managed Care – PPO | Admitting: Internal Medicine

## 2022-03-03 DIAGNOSIS — E559 Vitamin D deficiency, unspecified: Secondary | ICD-10-CM | POA: Diagnosis not present

## 2022-03-03 DIAGNOSIS — Z8249 Family history of ischemic heart disease and other diseases of the circulatory system: Secondary | ICD-10-CM | POA: Diagnosis not present

## 2022-03-04 ENCOUNTER — Encounter: Payer: Self-pay | Admitting: Internal Medicine

## 2022-03-04 ENCOUNTER — Ambulatory Visit: Payer: BC Managed Care – PPO | Attending: Internal Medicine | Admitting: Internal Medicine

## 2022-03-04 VITALS — BP 160/90 | HR 99 | Ht 63.5 in | Wt 134.8 lb

## 2022-03-04 DIAGNOSIS — R0689 Other abnormalities of breathing: Secondary | ICD-10-CM

## 2022-03-04 DIAGNOSIS — E781 Pure hyperglyceridemia: Secondary | ICD-10-CM

## 2022-03-04 DIAGNOSIS — R030 Elevated blood-pressure reading, without diagnosis of hypertension: Secondary | ICD-10-CM | POA: Diagnosis not present

## 2022-03-04 DIAGNOSIS — E559 Vitamin D deficiency, unspecified: Secondary | ICD-10-CM | POA: Diagnosis not present

## 2022-03-04 DIAGNOSIS — Z8249 Family history of ischemic heart disease and other diseases of the circulatory system: Secondary | ICD-10-CM

## 2022-03-04 LAB — LIPID PANEL
Chol/HDL Ratio: 21.7 ratio — ABNORMAL HIGH (ref 0.0–4.4)
Cholesterol, Total: 326 mg/dL — ABNORMAL HIGH (ref 100–199)
HDL: 15 mg/dL — ABNORMAL LOW (ref >39)
Triglycerides: 1713 mg/dL (ref 0–149)

## 2022-03-04 LAB — VITAMIN D 25 HYDROXY (VIT D DEFICIENCY, FRACTURES): Vit D, 25-Hydroxy: 18.4 ng/mL — ABNORMAL LOW (ref 30.0–100.0)

## 2022-03-04 MED ORDER — ATORVASTATIN CALCIUM 10 MG PO TABS: 10.0000 mg | ORAL_TABLET | Freq: Every day | ORAL | 3 refills | Status: DC

## 2022-03-04 NOTE — Progress Notes (Signed)
LIPID CLINIC CONSULT NOTE  Chief Complaint:  Follow-up dyslipidemia  Primary Care Physician: Maurice Small, MD  Primary Cardiologist:  None  HPI:  Christine Giles is a 56 y.o. female who is being seen today for the evaluation of dyslipidemia at the request of Maurice Small, MD. This is a pleasant female who is here today for telephone follow-up of dyslipidemia, primarily high triglycerides.  Previously based on her low LDL cholesterol I recommended decreasing her statin and adding fenofibrate to her Vascepa.  Her triglycerides have been as high as the 900s and improved to just over 500.  Repeat lipids were recently performed which demonstrated further reduction in total cholesterol from 139-113.  Triglycerides were decreased from 566-497 but her HDL cholesterol is lower at 20 and her LDL is also lower at 25.  These findings are certainly not explained by lowering her statin or adding fenofibrate.  It seems that also she has started more physical activity in fact she is exercising on a daily basis which is new for her.  She certainly feels better with this and I suspect this is lowered her cholesterol more significantly.  At this point with her LDL being quite low, we discussed management options.  Fortunately she had a recent coronary calcium score which showed no coronary calcium therefore I am not as concerned about this dyslipidemia at this point.  07/02/2020  [name] returns today for virtual follow-up.  She has been on the Vascepa as well as fenofibrate 134 mg and previously was on atorvastatin which I had reduced to 10 mg daily.  This medicine was stopped due to a very low LDL cholesterol however unfortunately her repeat lipids show an increase in her triglycerides which is not unexpected.  Is now showed total cholesterol 167, triglycerides 656, HDL 25 and LDL 48.  Her numbers seem to be somewhat better on the atorvastatin 10 mg daily and we discussed restarting that.  Of note also her  vitamin D level was severely low at 11.3 ng/mL, but has successfully been repleted now up to 33.1 ng/mL which is in the normal range.  I advised her to remain on 2000 IU daily vitamin D.  03/04/2022  [name] is seen today in follow-up.  Although she feels well she has quite a few abnormalities on her lab work today.  Her lipids are much worse.  Triglycerides are now 1713, up from 656.  Her vitamin D level is down to 18.4, but previously was 33 on 2000 IU daily.  For some reason she stopped this and is only taking a multivitamin.  She reports regular alcohol use, 2 glasses of wine daily.  She was previously taking atorvastatin more regularly but only takes it 3 times a week.  This is a 10 mg dose.  She reports compliance with fenofibrate and Vascepa.  She says she has been less active.  Diet she says has been stable.  Of note her blood pressure was significantly elevated today 192/102.  I did recheck this and it came down to 160/90.  He reports she has had some difficulty getting a full breath over the past several weeks.  She had what was thought to be an upper respiratory infection was seen at an urgent care and by her PCP.  While that has resolved she still has some lingering symptoms.  She gets some relief with an albuterol inhaler.  She is a former smoker.  She denies any wheezing or cough.  PMHx:  Past  Medical History:  Diagnosis Date   Anxiety    Depression    GERD (gastroesophageal reflux disease)    Hyperlipemia    Mucoid cyst of joint    right index finger    Past Surgical History:  Procedure Laterality Date   CESAREAN SECTION     CYST REMOVAL TRUNK     back   EAR CYST EXCISION Right 10/22/2014   Procedure: EXCISION MUCOID TUMOR ;  Surgeon: Cindee SaltGary Kuzma, MD;  Location: [facility];  Service: Orthopedics;  Laterality: Right;   ROTATION FLAP Right 10/22/2014   Procedure: ROTATION FLAP RIGHT INDEX FINGER ;  Surgeon: Cindee SaltGary Kuzma, MD;  Location: [facility];   Service: Orthopedics;  Laterality: Right;    FAMHx:  Family History  Problem Relation Age of Onset   CAD Father     SOCHx:   reports that she has quit smoking. She has never used smokeless tobacco. She reports current alcohol use. She reports that she does not use drugs.  ALLERGIES:  No Known Allergies  ROS: Pertinent items noted in HPI and remainder of comprehensive ROS otherwise negative.  HOME MEDS: Current Outpatient Medications on File Prior to Visit  Medication Sig Dispense Refill   ALPRAZolam (XANAX) 0.5 MG tablet Take 0.5 mg by mouth as needed.     Cholecalciferol (VITAMIN D3) 25 MCG (1000 UT) CAPS Take 2,000 Units by mouth daily.     fenofibrate micronized (LOFIBRA) 134 MG capsule Take 1 capsule (134 mg total) by mouth daily before breakfast. PATIENT MUST KEEP SCHEDULE APPOINTMENT FOR FUTURE REFILLS 30 capsule 0   fluticasone (FLONASE) 50 MCG/ACT nasal spray Place into the nose as needed.     icosapent Ethyl (VASCEPA) 1 g capsule Take 2 g by mouth 2 (two) times daily.     levonorgestrel-ethinyl estradiol (SEASONALE,INTROVALE,JOLESSA) 0.15-0.03 MG tablet Take 1 tablet by mouth daily.     omeprazole (PRILOSEC) 20 MG capsule Take 20 mg by mouth daily.     sertraline (ZOLOFT) 25 MG tablet Take 25 mg by mouth daily.     zolpidem (AMBIEN) 10 MG tablet Take 10 mg by mouth at bedtime as needed.     No current facility-administered medications on file prior to visit.    LABS/IMAGING: Results for orders placed or performed in visit on 07/02/20 (from the past 48 hour(s))  Lipid panel     Status: Abnormal   Collection Time: 03/03/22  8:56 AM  Result Value Ref Range   Cholesterol, Total 326 (H) 100 - 199 mg/dL   Triglycerides 9,6041,713 (HH) 0 - 149 mg/dL    Comment: Results confirmed on dilution.    HDL 15 (L) >39 mg/dL   VLDL Cholesterol Cal Comment (A) 5 - 40 mg/dL    Comment: The calculation for the VLDL cholesterol is not valid when triglyceride level is >800 mg/dL.     LDL Chol Calc (NIH) Comment (A) 0 - 99 mg/dL    Comment: Triglyceride result indicated is too high for an accurate LDL cholesterol estimation.    Chol/HDL Ratio 21.7 (H) 0.0 - 4.4 ratio    Comment:                                   T. Chol/HDL Ratio  Men  Women                               1/2 Avg.Risk  3.4    3.3                                   Avg.Risk  5.0    4.4                                2X Avg.Risk  9.6    7.1                                3X Avg.Risk 23.4   11.0   Vitamin D (25 hydroxy)     Status: Abnormal   Collection Time: 03/03/22  8:56 AM  Result Value Ref Range   Vit D, 25-Hydroxy 18.4 (L) 30.0 - 100.0 ng/mL    Comment: Vitamin D deficiency has been defined by the Institute of Medicine and an Endocrine Society practice guideline as a level of serum 25-OH vitamin D less than 20 ng/mL (1,2). The Endocrine Society went on to further define vitamin D insufficiency as a level between 21 and 29 ng/mL (2). 1. IOM (Institute of Medicine). 2010. Dietary reference    intakes for calcium and D. [place]: The    [employer]. 2. Holick MF, Binkley Rico, Bischoff-Ferrari HA, et al.    Evaluation, treatment, and prevention of vitamin D    deficiency: an Endocrine Society clinical practice    guideline. JCEM. 2011 Jul; 96(7):1911-30.    No results found.  LIPID PANEL:    Component Value Date/Time   CHOL 326 (H) 03/03/2022 0856   TRIG 1,713 (HH) 03/03/2022 0856   HDL 15 (L) 03/03/2022 0856   CHOLHDL 21.7 (H) 03/03/2022 0856   LDLCALC Comment (A) 03/03/2022 0856    WEIGHTS: Wt Readings from Last 3 Encounters:  03/04/22 134 lb 12.8 oz (61.1 kg)  07/02/20 131 lb (59.4 kg)  03/11/20 128 lb (58.1 kg)    VITALS: BP (!) 160/90   Pulse 99   Ht 5' 3.5" (1.613 m)   Wt 134 lb 12.8 oz (61.1 kg)   SpO2 98%   BMI 23.50 kg/m   EXAM: General appearance: alert and no distress Neck: no carotid bruit, no JVD, and  thyroid not enlarged, symmetric, no tenderness/mass/nodules Lungs: clear to auscultation bilaterally Heart: regular rate and rhythm Abdomen: soft, non-tender; bowel sounds normal; no masses,  no organomegaly Extremities: extremities normal, atraumatic, no cyanosis or edema Pulses: 2+ and symmetric Skin: Skin color, texture, turgor normal. No rashes or lesions Neurologic: Grossly normal Psych: Pleasant  EKG: Deferred  ASSESSMENT: Primary hypertriglyceridemia Vitamin D deficiency Family history of early coronary disease Difficulty breathing Elevated blood pressure  PLAN: 1.   Ms Lindahl has very poorly controlled triglycerides at this point despite being compliant with her medications.  I advise increasing her atorvastatin back to 10 mg daily.  She should stop her alcohol use if possible.  She needs to get more physical activity.  She might be a candidate for one of our triglyceride lowering clinical trials.  This was considered before but because of lack of coronary disease she did not qualify.  I will refer  her back for evaluation.  Blood pressure was significantly elevated today.  She is not on therapy.  She felt that this was mostly whitecoat but I would like her to take blood pressure readings at home.  If this continues to be elevated, she may need to start on medication.  Finally, she had a URI within the past months.  After treatment and antibiotics and seeing her PCP she says she still feels like it is difficult to get a deep breath.  Albuterol is helpful, making me wonder if she has some reactive airway disease.  She does have a history of smoking in the past.  She might need an inhaled steroid/bronchodilator combination.  I have encouraged her to reach out to her PCP again about this. She should restart vitamin D 2000 units daily.  Will plan to repeat lipids in about 3 months as well as Vit D and follow-up with me at that time.  Chrystie Nose, MD, [facility], FACP  [place]  [facility]  HeartCare  Medical Director of the Advanced Lipid Disorders &  Cardiovascular Risk Reduction Clinic Diplomate of the American Board of Clinical Lipidology Attending Cardiologist  Direct Dial: [phone]  Fax: [phone]  Website:  [email or web address] Nicely Dashanna Kinnamon 03/04/2022, 5:06 PM

## 2022-03-04 NOTE — Patient Instructions (Addendum)
Medication Instructions:  RESUME vitamin D 2000IU daily  RESUME atorvastatin 10mg  every day   *If you need a refill on your cardiac medications before your next appointment, please call your pharmacy*   Lab Work: FASTING lab work in 3-4 months - lipid panel, direct LDL, LPa, vitamin D, liver function  If you have labs (blood work) drawn today and your tests are completely normal, you will receive your results only by: MyChart Message (if you have MyChart) OR A paper copy in the mail If you have any lab test that is abnormal or we need to change your treatment, we will call you to review the results.   Testing/Procedures: NONE   Follow-Up: At [facility], you and your health needs are our priority.  As part of our continuing mission to provide you with exceptional heart care, we have created designated Provider Care Teams.  These Care Teams include your primary Cardiologist (physician) and Advanced Practice Providers (APPs -  Physician Assistants and Nurse Practitioners) who all work together to provide you with the care you need, when you need it.  We recommend signing up for the patient portal called "MyChart".  Sign up information is provided on this After Visit Summary.  MyChart is used to connect with patients for Virtual Visits (Telemedicine).  Patients are able to view lab/test results, encounter notes, upcoming appointments, etc.  Non-urgent messages can be sent to your provider as well.   To learn more about what you can do with MyChart, go to [facility].    Your next appointment:    3-4 months with [email or web address]

## 2022-03-07 ENCOUNTER — Other Ambulatory Visit: Payer: Self-pay | Admitting: Internal Medicine

## 2022-04-20 DIAGNOSIS — Z1231 Encounter for screening mammogram for malignant neoplasm of breast: Secondary | ICD-10-CM | POA: Diagnosis not present

## 2022-04-20 DIAGNOSIS — Z01419 Encounter for gynecological examination (general) (routine) without abnormal findings: Secondary | ICD-10-CM | POA: Diagnosis not present

## 2022-04-20 DIAGNOSIS — Z124 Encounter for screening for malignant neoplasm of cervix: Secondary | ICD-10-CM | POA: Diagnosis not present

## 2022-04-20 DIAGNOSIS — Z6823 Body mass index (BMI) 23.0-23.9, adult: Secondary | ICD-10-CM | POA: Diagnosis not present

## 2022-05-06 DIAGNOSIS — J45998 Other asthma: Secondary | ICD-10-CM | POA: Diagnosis not present

## 2022-05-19 ENCOUNTER — Telehealth: Payer: Self-pay | Admitting: Internal Medicine

## 2022-05-19 NOTE — Telephone Encounter (Signed)
Patient wants to know if she could schedule a calcium scan before March appt or after

## 2022-05-19 NOTE — Telephone Encounter (Signed)
Spoke with patient. Advised no repeat CAC score is needed so soon, as last was 2021. Advised IF repeated, is closer to 10 year space between the two.   Reminded her of fasting labs before next visit

## 2022-06-03 DIAGNOSIS — E559 Vitamin D deficiency, unspecified: Secondary | ICD-10-CM | POA: Diagnosis not present

## 2022-06-03 DIAGNOSIS — E781 Pure hyperglyceridemia: Secondary | ICD-10-CM | POA: Diagnosis not present

## 2022-06-04 LAB — LIPOPROTEIN A (LPA)

## 2022-06-04 LAB — LIPID PANEL
Cholesterol, Total: 149 mg/dL (ref 100–199)
LDL Chol Calc (NIH): 37 mg/dL (ref 0–99)

## 2022-06-04 LAB — HEPATIC FUNCTION PANEL
ALT: 54 IU/L — ABNORMAL HIGH (ref 0–32)
AST: 60 IU/L — ABNORMAL HIGH (ref 0–40)
Alkaline Phosphatase: 352 IU/L — ABNORMAL HIGH (ref 44–121)
Total Protein: 7.1 g/dL (ref 6.0–8.5)

## 2022-06-04 LAB — VITAMIN D 25 HYDROXY (VIT D DEFICIENCY, FRACTURES): Vit D, 25-Hydroxy: 41.8 ng/mL (ref 30.0–100.0)

## 2022-06-05 LAB — HEPATIC FUNCTION PANEL
Albumin: 3.8 g/dL (ref 3.8–4.9)
Bilirubin Total: 0.5 mg/dL (ref 0.0–1.2)
Bilirubin, Direct: 0.2 mg/dL (ref 0.00–0.40)

## 2022-06-05 LAB — LIPID PANEL
Chol/HDL Ratio: 6.2 ratio — ABNORMAL HIGH (ref 0.0–4.4)
HDL: 24 mg/dL — ABNORMAL LOW (ref >39)
Triglycerides: 632 mg/dL (ref 0–149)
VLDL Cholesterol Cal: 88 mg/dL — ABNORMAL HIGH (ref 5–40)

## 2022-06-05 LAB — LDL CHOLESTEROL, DIRECT: LDL Direct: 35 mg/dL (ref 0–99)

## 2022-06-09 ENCOUNTER — Ambulatory Visit: Payer: BC Managed Care – PPO | Attending: Internal Medicine | Admitting: Internal Medicine

## 2022-06-09 ENCOUNTER — Encounter: Payer: Self-pay | Admitting: Internal Medicine

## 2022-06-09 VITALS — BP 158/90 | HR 89 | Ht 63.5 in | Wt 130.0 lb

## 2022-06-09 DIAGNOSIS — E559 Vitamin D deficiency, unspecified: Secondary | ICD-10-CM

## 2022-06-09 DIAGNOSIS — E781 Pure hyperglyceridemia: Secondary | ICD-10-CM

## 2022-06-09 DIAGNOSIS — R052 Subacute cough: Secondary | ICD-10-CM | POA: Diagnosis not present

## 2022-06-09 DIAGNOSIS — I10 Essential (primary) hypertension: Secondary | ICD-10-CM | POA: Diagnosis not present

## 2022-06-09 MED ORDER — VALSARTAN 80 MG PO TABS: 80.0000 mg | ORAL_TABLET | Freq: Every day | ORAL | 3 refills | Status: DC

## 2022-06-09 NOTE — Patient Instructions (Signed)
Medication Instructions:  START valsartan '80mg'$  daily  Could try to add antihistamine at night for cough  *If you need a refill on your cardiac medications before your next appointment, please call your pharmacy*   Lab Work: FASTING lab work in 6 months ** complete before next appointment  If you have labs (blood work) drawn today and your tests are completely normal, you will receive your results only by: [place] (if you have MyChart) OR A paper copy in the mail If you have any lab test that is abnormal or we need to change your treatment, we will call you to review the results.   Testing/Procedures: NONE   Follow-Up: At [facility], you and your health needs are our priority.  As part of our continuing mission to provide you with exceptional heart care, we have created designated Provider Care Teams.  These Care Teams include your primary Cardiologist (physician) and Advanced Practice Providers (APPs -  Physician Assistants and Nurse Practitioners) who all work together to provide you with the care you need, when you need it.  We recommend signing up for the patient portal called "MyChart".  Sign up information is provided on this After Visit Summary.  MyChart is used to connect with patients for Virtual Visits (Telemedicine).  Patients are able to view lab/test results, encounter notes, upcoming appointments, etc.  Non-urgent messages can be sent to your provider as well.   To learn more about what you can do with MyChart, go to [email or web address].    Your next appointment:    6 months with Dr. Debara Pickett

## 2022-06-09 NOTE — Progress Notes (Signed)
LIPID CLINIC CONSULT NOTE  Chief Complaint:  Follow-up dyslipidemia  Primary Care Physician: Kelton Pillar, MD  Primary Cardiologist:  None  HPI:  Christine Giles is a 57 y.o. female who is being seen today for the evaluation of dyslipidemia at the request of Kelton Pillar, MD. This is a pleasant female who is here today for telephone follow-up of dyslipidemia, primarily high triglycerides.  Previously based on her low LDL cholesterol I recommended decreasing her statin and adding fenofibrate to her Vascepa.  Her triglycerides have been as high as the 900s and improved to just over 500.  Repeat lipids were recently performed which demonstrated further reduction in total cholesterol from 139-113.  Triglycerides were decreased from 566-497 but her HDL cholesterol is lower at 20 and her LDL is also lower at 25.  These findings are certainly not explained by lowering her statin or adding fenofibrate.  It seems that also she has started more physical activity in fact she is exercising on a daily basis which is new for her.  She certainly feels better with this and I suspect this is lowered her cholesterol more significantly.  At this point with her LDL being quite low, we discussed management options.  Fortunately she had a recent coronary calcium score which showed no coronary calcium therefore I am not as concerned about this dyslipidemia at this point.  07/02/2020  Christine Giles returns today for virtual follow-up.  She has been on the Vascepa as well as fenofibrate 134 mg and previously was on atorvastatin which I had reduced to 10 mg daily.  This medicine was stopped due to a very low LDL cholesterol however unfortunately her repeat lipids show an increase in her triglycerides which is not unexpected.  Is now showed total cholesterol 167, triglycerides 656, HDL 25 and LDL 48.  Her numbers seem to be somewhat better on the atorvastatin 10 mg daily and we discussed restarting that.  Of note also her  vitamin D level was severely low at 11.3 ng/mL, but has successfully been repleted now up to 33.1 ng/mL which is in the normal range.  I advised her to remain on 2000 IU daily vitamin D.  03/04/2022  Christine Giles is seen today in follow-up.  Although she feels well she has quite a few abnormalities on her lab work today.  Her lipids are much worse.  Triglycerides are now 1713, up from 656.  Her vitamin D level is down to 18.4, but previously was 33 on 2000 IU daily.  For some reason she stopped this and is only taking a multivitamin.  She reports regular alcohol use, 2 glasses of wine daily.  She was previously taking atorvastatin more regularly but only takes it 3 times a week.  This is a 10 mg dose.  She reports compliance with fenofibrate and Vascepa.  She says she has been less active.  Diet she says has been stable.  Of note her blood pressure was significantly elevated today 192/102.  I did recheck this and it came down to 160/90.  He reports she has had some difficulty getting a full breath over the past several weeks.  She had what was thought to be an upper respiratory infection was seen at an urgent care and by her PCP.  While that has resolved she still has some lingering symptoms.  She gets some relief with an albuterol inhaler.  She is a former smoker.  She denies any wheezing or cough.  06/09/2022  Ms.  Giles is seen today in follow-up. Her cholesterol has improved, specifically her triglycerides which are down to 632 from 1713.  Her direct LDL is 35.  LP(a) was assessed and is negative at 13.2 nmol/L.  Her vitamin D is responded significantly up to 41.8 from 18.4.  AST and ALT are mildly elevated at 60 and 54, respectively.  Blood pressure was monitored at home.  She brought in a list of her blood pressures which show some lability but in general are somewhat elevated.  Based on this I would favor treatment and she is agreeable to that today.  Will plan to start valsartan 80 mg daily.  She had started  more exercise but recently had URI symptoms.  She has had lingering cough but had shortness of breath which improved with steroids and albuterol.  She reports her cough is worse in the morning which makes me think there might be an inflammatory component in her mucus drainage related.  She does have a 35-pack-year smoking history.   PMHx:  Past Medical History:  Diagnosis Date   Anxiety    Depression    GERD (gastroesophageal reflux disease)    Hyperlipemia    Mucoid cyst of joint    right index finger    Past Surgical History:  Procedure Laterality Date   CESAREAN SECTION     CYST REMOVAL TRUNK     back   EAR CYST EXCISION Right 10/22/2014   Procedure: EXCISION MUCOID TUMOR ;  Surgeon: Daryll Brod, MD;  Location: [place];  Service: Orthopedics;  Laterality: Right;   ROTATION FLAP Right 10/22/2014   Procedure: ROTATION FLAP RIGHT INDEX FINGER ;  Surgeon: Daryll Brod, MD;  Location: [place];  Service: Orthopedics;  Laterality: Right;    FAMHx:  Family History  Problem Relation Age of Onset   CAD Father     SOCHx:   reports that she has quit smoking. She has never used smokeless tobacco. She reports current alcohol use. She reports that she does not use drugs.  ALLERGIES:  No Known Allergies  ROS: Pertinent items noted in HPI and remainder of comprehensive ROS otherwise negative.  HOME MEDS: Current Outpatient Medications on File Prior to Visit  Medication Sig Dispense Refill   ALPRAZolam (XANAX) 0.5 MG tablet Take 0.5 mg by mouth as needed.     atorvastatin (LIPITOR) 10 MG tablet Take 1 tablet (10 mg total) by mouth at bedtime. 90 tablet 3   Cholecalciferol (VITAMIN D3) 25 MCG (1000 UT) CAPS Take 2,000 Units by mouth daily.     fenofibrate micronized (LOFIBRA) 134 MG capsule Take 1 capsule (134 mg total) by mouth daily before breakfast. 30 capsule 11   fluticasone (FLONASE) 50 MCG/ACT nasal spray Place into the nose as needed.      icosapent Ethyl (VASCEPA) 1 g capsule Take 2 g by mouth 2 (two) times daily.     levonorgestrel-ethinyl estradiol (SEASONALE,INTROVALE,JOLESSA) 0.15-0.03 MG tablet Take 1 tablet by mouth daily.     omeprazole (PRILOSEC) 20 MG capsule Take 20 mg by mouth daily.     sertraline (ZOLOFT) 25 MG tablet Take 25 mg by mouth daily.     zolpidem (AMBIEN) 10 MG tablet Take 10 mg by mouth at bedtime as needed.     No current facility-administered medications on file prior to visit.    LABS/IMAGING: No results found for this or any previous visit (from the past 48 hour(s)).  No results found.  LIPID PANEL:  Component Value Date/Time   CHOL 149 06/03/2022 0845   TRIG 632 (HH) 06/03/2022 0845   HDL 24 (L) 06/03/2022 0845   CHOLHDL 6.2 (H) 06/03/2022 0845   LDLCALC 37 06/03/2022 0845   LDLDIRECT 35 06/03/2022 0845    WEIGHTS: Wt Readings from Last 3 Encounters:  06/09/22 130 lb (59 kg)  03/04/22 134 lb 12.8 oz (61.1 kg)  07/02/20 131 lb (59.4 kg)    VITALS: BP (!) 158/90 (BP Location: Left Arm, Patient Position: Sitting, Cuff Size: Normal)   Pulse 89   Ht 5' 3.5" (1.613 m)   Wt 130 lb (59 kg)   BMI 22.67 kg/m   EXAM: General appearance: alert and no distress Neck: no carotid bruit, no JVD, and thyroid not enlarged, symmetric, no tenderness/mass/nodules Lungs: clear to auscultation bilaterally Heart: regular rate and rhythm  EKG: Deferred  ASSESSMENT: Primary hypertriglyceridemia Vitamin D deficiency Family history of early coronary disease Difficulty breathing HTN  PLAN: 1.   Ms Pachuca has had improvement in her triglycerides however they remain elevated.  Her shortness of breath seem to have improved with steroids and albuterol although she has had some persistent cough.  I suspect there might be an allergic component.  I advise considering an antihistamine in addition to her treatments.  She is also using a nasal steroid.  She denies any reflux symptoms.  There is notably  some mild elevation in AST and ALT as well as some alkaline phosphatase elevation.  This might suggest biliary source rather than steatohepatitis, but she is asymptomatic denying any abdominal pain.  I would not suggest any increase in her statin at this time because of her liver enzymes.  We will follow-up those with repeat lipids in about 6 months.  With regards to elevated blood pressure, she is agreeable to starting therapy and would recommend starting valsartan 80 mg daily.  She should monitor blood pressures at home.  Plan follow-up in 6 months or sooner as necessary.  Pixie Casino, MD, [facility], [place] Director of the [facility] Diplomate of the American Board of Clinical Lipidology Attending Cardiologist  Direct Dial: [phone]  Fax: [phone]  Website:  [email or web address] 06/09/2022, 4:38 PM

## 2022-12-08 DIAGNOSIS — E782 Mixed hyperlipidemia: Secondary | ICD-10-CM | POA: Diagnosis not present

## 2022-12-08 DIAGNOSIS — J302 Other seasonal allergic rhinitis: Secondary | ICD-10-CM | POA: Diagnosis not present

## 2022-12-08 DIAGNOSIS — G4700 Insomnia, unspecified: Secondary | ICD-10-CM | POA: Diagnosis not present

## 2022-12-08 DIAGNOSIS — K219 Gastro-esophageal reflux disease without esophagitis: Secondary | ICD-10-CM | POA: Diagnosis not present

## 2022-12-10 ENCOUNTER — Ambulatory Visit: Payer: BC Managed Care – PPO | Attending: Internal Medicine | Admitting: Internal Medicine

## 2023-02-03 DIAGNOSIS — J4521 Mild intermittent asthma with (acute) exacerbation: Secondary | ICD-10-CM | POA: Diagnosis not present

## 2023-02-03 DIAGNOSIS — J0100 Acute maxillary sinusitis, unspecified: Secondary | ICD-10-CM | POA: Diagnosis not present

## 2023-02-03 DIAGNOSIS — Z681 Body mass index (BMI) 19 or less, adult: Secondary | ICD-10-CM | POA: Diagnosis not present

## 2023-02-26 ENCOUNTER — Other Ambulatory Visit: Payer: Self-pay | Admitting: Internal Medicine

## 2023-05-03 DIAGNOSIS — Z6823 Body mass index (BMI) 23.0-23.9, adult: Secondary | ICD-10-CM | POA: Diagnosis not present

## 2023-05-03 DIAGNOSIS — Z01419 Encounter for gynecological examination (general) (routine) without abnormal findings: Secondary | ICD-10-CM | POA: Diagnosis not present

## 2023-05-03 DIAGNOSIS — Z124 Encounter for screening for malignant neoplasm of cervix: Secondary | ICD-10-CM | POA: Diagnosis not present

## 2023-05-03 DIAGNOSIS — Z1231 Encounter for screening mammogram for malignant neoplasm of breast: Secondary | ICD-10-CM | POA: Diagnosis not present

## 2023-05-12 DIAGNOSIS — E8941 Symptomatic postprocedural ovarian failure: Secondary | ICD-10-CM | POA: Diagnosis not present

## 2023-05-20 DIAGNOSIS — L600 Ingrowing nail: Secondary | ICD-10-CM | POA: Diagnosis not present

## 2023-05-23 DIAGNOSIS — L600 Ingrowing nail: Secondary | ICD-10-CM | POA: Diagnosis not present

## 2023-05-27 ENCOUNTER — Other Ambulatory Visit: Payer: Self-pay | Admitting: Internal Medicine

## 2023-07-15 DIAGNOSIS — E782 Mixed hyperlipidemia: Secondary | ICD-10-CM | POA: Diagnosis not present

## 2023-07-15 DIAGNOSIS — Z23 Encounter for immunization: Secondary | ICD-10-CM | POA: Diagnosis not present

## 2023-07-15 DIAGNOSIS — Z0000 Encounter for general adult medical examination without abnormal findings: Secondary | ICD-10-CM | POA: Diagnosis not present

## 2023-07-15 LAB — LAB REPORT - SCANNED: EGFR: 78

## 2023-08-03 DIAGNOSIS — R748 Abnormal levels of other serum enzymes: Secondary | ICD-10-CM | POA: Diagnosis not present

## 2023-08-21 ENCOUNTER — Other Ambulatory Visit: Payer: Self-pay | Admitting: Internal Medicine

## 2023-09-17 ENCOUNTER — Other Ambulatory Visit: Payer: Self-pay | Admitting: Internal Medicine

## 2023-10-20 DIAGNOSIS — R748 Abnormal levels of other serum enzymes: Secondary | ICD-10-CM | POA: Diagnosis not present

## 2024-01-17 DIAGNOSIS — R748 Abnormal levels of other serum enzymes: Secondary | ICD-10-CM | POA: Diagnosis not present

## 2024-01-17 DIAGNOSIS — E782 Mixed hyperlipidemia: Secondary | ICD-10-CM | POA: Diagnosis not present

## 2024-01-17 DIAGNOSIS — Z23 Encounter for immunization: Secondary | ICD-10-CM | POA: Diagnosis not present

## 2024-01-17 DIAGNOSIS — J438 Other emphysema: Secondary | ICD-10-CM | POA: Diagnosis not present

## 2024-01-17 DIAGNOSIS — J45998 Other asthma: Secondary | ICD-10-CM | POA: Diagnosis not present

## 2024-01-17 DIAGNOSIS — F39 Unspecified mood [affective] disorder: Secondary | ICD-10-CM | POA: Diagnosis not present

## 2024-02-08 ENCOUNTER — Other Ambulatory Visit: Payer: Self-pay

## 2024-02-09 MED ORDER — VALSARTAN 80 MG PO TABS: 80.0000 mg | ORAL_TABLET | Freq: Every day | ORAL | 0 refills | Status: AC

## 2024-03-03 ENCOUNTER — Other Ambulatory Visit: Payer: Self-pay | Admitting: Internal Medicine

## 2024-03-04 ENCOUNTER — Other Ambulatory Visit: Payer: Self-pay | Admitting: Internal Medicine

## 2024-03-16 ENCOUNTER — Other Ambulatory Visit: Payer: Self-pay | Admitting: Internal Medicine
